# Patient Record
Sex: Male | Born: 1980 | Race: Black or African American | Hispanic: No | Marital: Married | Smoking: Former smoker
Health system: Southern US, Community
[De-identification: ages and names within clinical notes are randomized; demographics above are authoritative.]

## PROBLEM LIST (undated history)

## (undated) DIAGNOSIS — Z6834 Body mass index (BMI) 34.0-34.9, adult: Secondary | ICD-10-CM

## (undated) DIAGNOSIS — M545 Low back pain, unspecified: Secondary | ICD-10-CM

## (undated) DIAGNOSIS — I10 Essential (primary) hypertension: Secondary | ICD-10-CM

## (undated) DIAGNOSIS — I499 Cardiac arrhythmia, unspecified: Secondary | ICD-10-CM

## (undated) HISTORY — DX: Body mass index (BMI) 34.0-34.9, adult: Z68.34

## (undated) HISTORY — PX: NO PAST SURGERIES: SHX2092

## (undated) HISTORY — DX: Low back pain: M54.5

## (undated) HISTORY — DX: Cardiac arrhythmia, unspecified: I49.9

## (undated) HISTORY — DX: Low back pain, unspecified: M54.50

---

## 2001-04-28 ENCOUNTER — Emergency Department (HOSPITAL_COMMUNITY): Admission: EM | Admit: 2001-04-28 | Discharge: 2001-04-28 | Payer: Self-pay | Admitting: Emergency Medicine

## 2001-09-29 ENCOUNTER — Emergency Department (HOSPITAL_COMMUNITY): Admission: EM | Admit: 2001-09-29 | Discharge: 2001-09-29 | Payer: Self-pay | Admitting: Emergency Medicine

## 2001-12-29 ENCOUNTER — Emergency Department (HOSPITAL_COMMUNITY): Admission: EM | Admit: 2001-12-29 | Discharge: 2001-12-29 | Payer: Self-pay | Admitting: Emergency Medicine

## 2002-07-14 ENCOUNTER — Emergency Department (HOSPITAL_COMMUNITY): Admission: EM | Admit: 2002-07-14 | Discharge: 2002-07-14 | Payer: Self-pay | Admitting: Emergency Medicine

## 2002-12-19 ENCOUNTER — Emergency Department (HOSPITAL_COMMUNITY): Admission: EM | Admit: 2002-12-19 | Discharge: 2002-12-19 | Payer: Self-pay | Admitting: Emergency Medicine

## 2003-02-02 ENCOUNTER — Emergency Department (HOSPITAL_COMMUNITY): Admission: EM | Admit: 2003-02-02 | Discharge: 2003-02-02 | Payer: Self-pay | Admitting: Emergency Medicine

## 2003-08-18 ENCOUNTER — Emergency Department (HOSPITAL_COMMUNITY): Admission: EM | Admit: 2003-08-18 | Discharge: 2003-08-18 | Payer: Self-pay | Admitting: Emergency Medicine

## 2003-12-16 ENCOUNTER — Emergency Department (HOSPITAL_COMMUNITY): Admission: EM | Admit: 2003-12-16 | Discharge: 2003-12-16 | Payer: Self-pay | Admitting: Emergency Medicine

## 2004-03-12 ENCOUNTER — Emergency Department (HOSPITAL_COMMUNITY): Admission: EM | Admit: 2004-03-12 | Discharge: 2004-03-12 | Payer: Self-pay | Admitting: Emergency Medicine

## 2004-04-13 ENCOUNTER — Emergency Department (HOSPITAL_COMMUNITY): Admission: EM | Admit: 2004-04-13 | Discharge: 2004-04-13 | Payer: Self-pay | Admitting: Emergency Medicine

## 2004-05-08 ENCOUNTER — Emergency Department (HOSPITAL_COMMUNITY): Admission: EM | Admit: 2004-05-08 | Discharge: 2004-05-08 | Payer: Self-pay | Admitting: Emergency Medicine

## 2005-03-21 ENCOUNTER — Emergency Department (HOSPITAL_COMMUNITY): Admission: EM | Admit: 2005-03-21 | Discharge: 2005-03-21 | Payer: Self-pay | Admitting: Emergency Medicine

## 2005-11-30 ENCOUNTER — Inpatient Hospital Stay (HOSPITAL_COMMUNITY): Admission: EM | Admit: 2005-11-30 | Discharge: 2005-12-02 | Payer: Self-pay | Admitting: *Deleted

## 2005-11-30 ENCOUNTER — Ambulatory Visit: Payer: Self-pay | Admitting: Internal Medicine

## 2005-12-01 ENCOUNTER — Encounter: Payer: Self-pay | Admitting: Internal Medicine

## 2005-12-01 ENCOUNTER — Encounter: Payer: Self-pay | Admitting: Vascular Surgery

## 2005-12-09 ENCOUNTER — Ambulatory Visit: Payer: Self-pay | Admitting: *Deleted

## 2007-03-15 ENCOUNTER — Emergency Department (HOSPITAL_COMMUNITY): Admission: EM | Admit: 2007-03-15 | Discharge: 2007-03-15 | Payer: Self-pay | Admitting: Family Medicine

## 2007-08-15 ENCOUNTER — Emergency Department (HOSPITAL_COMMUNITY): Admission: EM | Admit: 2007-08-15 | Discharge: 2007-08-15 | Payer: Self-pay | Admitting: Family Medicine

## 2007-09-09 ENCOUNTER — Emergency Department (HOSPITAL_COMMUNITY): Admission: EM | Admit: 2007-09-09 | Discharge: 2007-09-09 | Payer: Self-pay | Admitting: Emergency Medicine

## 2007-09-20 ENCOUNTER — Emergency Department (HOSPITAL_COMMUNITY): Admission: EM | Admit: 2007-09-20 | Discharge: 2007-09-20 | Payer: Self-pay | Admitting: Emergency Medicine

## 2007-12-11 ENCOUNTER — Emergency Department (HOSPITAL_COMMUNITY): Admission: EM | Admit: 2007-12-11 | Discharge: 2007-12-11 | Payer: Self-pay | Admitting: Emergency Medicine

## 2008-07-10 ENCOUNTER — Emergency Department (HOSPITAL_COMMUNITY): Admission: EM | Admit: 2008-07-10 | Discharge: 2008-07-10 | Payer: Self-pay | Admitting: Family Medicine

## 2008-09-30 ENCOUNTER — Emergency Department (HOSPITAL_COMMUNITY): Admission: EM | Admit: 2008-09-30 | Discharge: 2008-09-30 | Payer: Self-pay | Admitting: Emergency Medicine

## 2008-12-05 ENCOUNTER — Emergency Department (HOSPITAL_COMMUNITY): Admission: EM | Admit: 2008-12-05 | Discharge: 2008-12-05 | Payer: Self-pay | Admitting: Emergency Medicine

## 2009-02-16 ENCOUNTER — Emergency Department (HOSPITAL_COMMUNITY): Admission: EM | Admit: 2009-02-16 | Discharge: 2009-02-16 | Payer: Self-pay | Admitting: Family Medicine

## 2010-03-03 ENCOUNTER — Emergency Department (HOSPITAL_BASED_OUTPATIENT_CLINIC_OR_DEPARTMENT_OTHER)
Admission: EM | Admit: 2010-03-03 | Discharge: 2010-03-03 | Disposition: A | Discharge status: Home / Self Care | Payer: No Typology Code available for payment source | Attending: Emergency Medicine | Admitting: Emergency Medicine

## 2010-03-03 ENCOUNTER — Emergency Department (INDEPENDENT_AMBULATORY_CARE_PROVIDER_SITE_OTHER): Payer: No Typology Code available for payment source

## 2010-03-03 ENCOUNTER — Emergency Department (HOSPITAL_BASED_OUTPATIENT_CLINIC_OR_DEPARTMENT_OTHER): Payer: No Typology Code available for payment source

## 2010-03-03 DIAGNOSIS — M545 Low back pain, unspecified: Secondary | ICD-10-CM | POA: Insufficient documentation

## 2010-03-03 DIAGNOSIS — M47817 Spondylosis without myelopathy or radiculopathy, lumbosacral region: Secondary | ICD-10-CM | POA: Insufficient documentation

## 2010-03-03 DIAGNOSIS — Y92410 Unspecified street and highway as the place of occurrence of the external cause: Secondary | ICD-10-CM | POA: Insufficient documentation

## 2010-03-03 DIAGNOSIS — I10 Essential (primary) hypertension: Secondary | ICD-10-CM | POA: Insufficient documentation

## 2010-03-03 DIAGNOSIS — R079 Chest pain, unspecified: Secondary | ICD-10-CM

## 2010-03-03 DIAGNOSIS — R071 Chest pain on breathing: Secondary | ICD-10-CM | POA: Insufficient documentation

## 2010-05-03 LAB — CBC
HCT: 44.1 % (ref 39.0–52.0)
Hemoglobin: 14.8 g/dL (ref 13.0–17.0)
MCHC: 33.6 g/dL (ref 30.0–36.0)
MCV: 87.9 fL (ref 78.0–100.0)
Platelets: 376 10*3/uL (ref 150–400)
RBC: 5.01 MIL/uL (ref 4.22–5.81)
RDW: 13.6 % (ref 11.5–15.5)
WBC: 9.3 10*3/uL (ref 4.0–10.5)

## 2010-05-03 LAB — COMPREHENSIVE METABOLIC PANEL
ALT: 26 U/L (ref 0–53)
AST: 23 U/L (ref 0–37)
Albumin: 4 g/dL (ref 3.5–5.2)
Alkaline Phosphatase: 73 U/L (ref 39–117)
BUN: 7 mg/dL (ref 6–23)
CO2: 24 mEq/L (ref 19–32)
Calcium: 8.7 mg/dL (ref 8.4–10.5)
Chloride: 110 mEq/L (ref 96–112)
Creatinine, Ser: 0.82 mg/dL (ref 0.4–1.5)
GFR calc Af Amer: >60 mL/min (ref >60)
GFR calc non Af Amer: >60 mL/min (ref >60)
Glucose, Bld: 111 mg/dL — ABNORMAL HIGH (ref 70–99)
Potassium: 4.2 mEq/L (ref 3.5–5.1)
Sodium: 140 mEq/L (ref 135–145)
Total Bilirubin: 0.9 mg/dL (ref 0.3–1.2)
Total Protein: 6.8 g/dL (ref 6.0–8.3)

## 2010-05-03 LAB — URINALYSIS, ROUTINE W REFLEX MICROSCOPIC
Bilirubin Urine: NEGATIVE
Glucose, UA: NEGATIVE mg/dL
Hgb urine dipstick: NEGATIVE
Ketones, ur: NEGATIVE mg/dL
Nitrite: NEGATIVE
Protein, ur: NEGATIVE mg/dL
Specific Gravity, Urine: 1.026 (ref 1.005–1.030)
Urobilinogen, UA: 0.2 mg/dL (ref 0.0–1.0)
pH: 5.5 (ref 5.0–8.0)

## 2010-05-03 LAB — DIFFERENTIAL
Basophils Absolute: 0 10*3/uL (ref 0.0–0.1)
Basophils Relative: 0 % (ref 0–1)
Eosinophils Absolute: 0.2 10*3/uL (ref 0.0–0.7)
Eosinophils Relative: 2 % (ref 0–5)
Lymphocytes Relative: 25 % (ref 12–46)
Lymphs Abs: 2.3 10*3/uL (ref 0.7–4.0)
Monocytes Absolute: 0.6 10*3/uL (ref 0.1–1.0)
Monocytes Relative: 6 % (ref 3–12)
Neutro Abs: 6.2 10*3/uL (ref 1.7–7.7)
Neutrophils Relative %: 66 % (ref 43–77)

## 2010-05-03 LAB — LIPASE, BLOOD: Lipase: 46 U/L (ref 11–59)

## 2010-06-14 NOTE — H&P (Signed)
Andres Ortiz, Andres Ortiz           ACCOUNT NO.:  [id number]   MEDICAL RECORD NO.:  [id number]          PATIENT TYPE:  INP   LOCATION:  2001                         FACILITY:  [facility]   PHYSICIAN:  Bevelyn Buckles. Bensimhon, MDDATE OF BIRTH:  1980/10/04   DATE OF ADMISSION:  11/30/2005  DATE OF DISCHARGE:                                HISTORY & PHYSICAL   He is new to [facility] cardiology.   REASON FOR ADMISSION:  Chest pain with ST elevation on EKG.   HISTORY OF PRESENT ILLNESS:  Andres Ortiz is 30 year old male with a history  of hypertension and obesity.  He denies any known cardiac history.  He has  never had a cardiac catheterization or stress test.  He tells me that for  the past 6 months, he has had intermittent chest discomfort.  This feels  pressure-like.  It is worse when he gets stressed out.  He says recently it  has been coming on more frequently.  This afternoon when he was at his  sister's house, he was working on a fence and noticed severe 10/10 central  chest pressure.  There are no associated symptoms.  He finally came to  emergency room for evaluation.  EKG shows sinus rhythm with a rate of 90.  There was 2-3 mm of ST elevation in V2, 1 mm in V1 with also 1 mm in I and  aVL.  In the ER, he was not having significant chest pain.  His first set of  cardiac markers were negative.   REVIEW OF SYSTEMS:  GENITOURINARY:  He denies any bright red blood per  rectum.  He has not had melena.  GASTROINTESTINAL:  His pain is worse when  sitting up and better when lying back.  He thinks it is probably related to  gas.  NEUROLOGIC:  He has not had any neurologic problems.  He denies any  claudication.  CARDIOVASCULAR:  No heart failure.  The remainder of his  systems is negative except for HPI and problem list.   PROBLEM LIST:  1. Hypertension.  2. Tobacco use, ongoing.   MEDICATIONS:  None.   ALLERGIES:  No known drug allergies.   SOCIAL HISTORY:  He lives in [place].  He  smokes about a pack of  cigarettes a day.  He drinks socially.  There is also a history of marijuana  use.  He denies any cocaine or other drugs.   FAMILY HISTORY:  His mother and father are alive and well in their 84s.  There is no family history of coronary artery disease.  He has one sister  who is alive and well.  He has four or five other sisters which he does not  know much about.   PHYSICAL EXAMINATION:  GENERAL:  He is lying in bed in no acute distress.  VITAL SIGNS:  Respirations unlabored, blood pressure is 145/93, pulse is 95  saturating 97% on room air.  HEENT:  Sclerae anicteric.  EOMI.  There is no xanthelasma.  Mucous  membranes are moist.  NECK:  Supple.  No JVD.  Carotids are 2+ bilaterally bruits.  There  is no  lymphadenopathy or thyromegaly.  CARDIAC:  Regular rate and rhythm.  No murmurs, rubs or gallops.  LUNGS:  Clear.  ABDOMEN:  Obese, nontender, nondistended, no hepatosplenomegaly, no bruits.  No masses.  Good bowel sounds.  EXTREMITIES:  Warm with no cyanosis,  clubbing or edema.  Femoral pulses are 2+ bilaterally without bruits.  DP  pulses are 2+.  There are no rashes or arthropathies.  NEUROLOGIC:  He is alert and oriented x3.  Cranial nerves 2-12 are intact.  He moves all four extremities without difficulty.   LABORATORY DATA AND X-RAY FINDINGS:  Point of care markers are negative with  a CK-MB of 2.1, troponin of less than 0.05.  Sodium 140, potassium 3.6,  chloride 111, BUN is 9, creatinine 1.1.  His hemoglobin is 15.3, white count  13.6, platelets 396.   EKG shows sinus rhythm at a rate of 90.  There is a nonspecific  interventricular conduction delay at 100 msec.  There is of 1 mm ST  elevation in V1, 3 mm in V2.  There is also a 1-2 mm in I and aVL.  Chest x-  ray is pending.   ASSESSMENT:  1. Stuttering chest pain with ST elevation on EKG with normal cardiac      markers.  2. Hypertension.  3. Tobacco use.  4. Obesity.   DISCUSSION/PLAN:   Given his young age and negative enzymes in the face of  significant chest pain, my suspicion for a ruptured plaque here is quite  low.  However, his EKG is quite concerning.  I have discussed the situation  with both him and the woman with him here this evening.  I have suggested  that we go to cardiac catheterization to clearly define his coronary  anatomy.  They have agreed to proceed with this route.  Also, a differential  would be focal myocarditis or coronary vasospasm.  We will assess these  further after his catheterization.  He also needs a smoking cessation  consult.      Bevelyn Buckles. Bensimhon, MD  Electronically Signed     DRB/MEDQ  D:  11/30/2005  T:  12/01/2005  Job:  [id number]

## 2010-06-14 NOTE — Discharge Summary (Signed)
Andres Ortiz, Andres Ortiz           ACCOUNT NO.:  [id number]   MEDICAL RECORD NO.:  [id number]          PATIENT TYPE:  INP   LOCATION:  2001                         FACILITY:  [facility]   PHYSICIAN:  Andres Ortiz, MDDATE OF BIRTH:  16-Aug-1980   DATE OF ADMISSION:  11/30/2005  DATE OF DISCHARGE:  12/02/2005                                 DISCHARGE SUMMARY   DATE OF BIRTH:  [birth year]   PRIMARY CARE PHYSICIAN:  None.   CARDIOLOGIST:  The patient is new to Dr. Arvilla Ortiz.   REASON FOR ADMISSION:  Chest pain with ST elevation on electrocardiogram.   DISCHARGE DIAGNOSES:  1. Noncardiac chest pain.  2. Normal coronary arteries by catheterization.  3. Normal left ventricular function.  4. Abnormal electrocardiogram.  5. Hypertension.  6. Glucose intolerance.  7. Tobacco abuse.  8. Right femoral artery pseudoaneurysm, status post pseudoaneurysm      compression.   PROCEDURES PERFORMED THIS ADMISSION:  Cardiac catheterization by Dr. Arvilla Ortiz, November 30, 2005, revealing normal coronary arteries, normal LV  function, no evidence of coronary vasospasm.   ADMISSION HISTORY:  Andres Ortiz is a very pleasant 30 year old male patient  with a history of hypertension and obesity, who presented to the emergency  room on the date of admission with complaints of chest pressure.  His EKG  shows 2-3 mm of ST elevation in V2, 1 mm in V1, and 1 mm of elevation and 1  in AVL.  His initial cardiac markers were negative.  It was felt that given  his young age and negative enzymes that the likelihood of ruptured plaque  was quite low.  However, with his abnormal EKG, it was felt that he should  go directly to the catheterization lab to define his coronary anatomy.   HOSPITAL COURSE:  The patient was taken directly to the catheterization lab  upon admission.  He underwent cardiac catheterization by Dr. Gala Ortiz.  As  noted above, he has normal coronary arteries.  His LV  function is normal.  His right femoral artery was closed with an Angio-Seal device.  Other  etiologies of his chest pain were evaluated.  An echocardiogram was done and  revealed an EF of 65%.  No left ventricular regional wall motion  abnormalities.  The LV wall thickness was mildly increased.  He underwent a  chest CT that revealed a normal thoracic aorta, no evidence of pulmonary  embolus, mediastinal tissue is unremarkable.  CT of the abdomen revealed  normal abdominal aorta.  There was a high density in the dependent  gallbladder lumen.  It was felt by the radiologist that this was probably  related to the vicarious excretion of contrast from the recent cardiac  catheterization. Layering sludge or stones could also have this appearance.  The patient was ready for discharge on December 01, 2005.  However, he  developed significant right groin pain.  An ultrasound showed a small  pseudoaneurysm of the common femoral artery.  He underwent pseudoaneurysm  compression on the groin December 02, 2005.  At the time of this dictation,  he is still pending relook  ultrasound to ensure that his pseudoaneurysm has  stayed closed.  As long as this ultrasound is negative, he is able to  ambulate without any changes in his groin site.  He will be discharged to  home later today.  Smoking cessation was initiated this admission.  He has  been placed on proton pump inhibitor therapy.  He will also be kept on  hypertensive medication with Toprol-XL 75 mg daily.  He has also been  prescribed some pain medication to help him with recovery from the  pseudoaneurysm compression.  He will follow up with the physician assistant  per Dr. Gala Ortiz in a couple weeks.   LABS AND X-RAY DATA:  Echocardiogram and CT of the chest and abdomen as  noted above.  White count 13,000, hemoglobin 14.8, hematocrit 43.3, platelet  count 373,000.  D. dimer less than 0.22.  Sodium 137, potassium 3.6,  chloride 106, CO2 25,  glucose 110, BUN 6, creatinine 0.9.  Total bilirubin  0.7, alkaline phosphatase 70, AST 23, ALT 46, total protein 6.2, albumin  3.7.  Hemoglobin A1c 6.1.  Cardiac markers negative x2.  Total cholesterol  204, triglycerides 170, HDL 33, LDL 136, TSH 1.111.  Chest x-ray on  admission revealed no acute abnormalities.   DISCHARGE MEDICATIONS:  1. Protonix 40 mg a day.  2. Percocet 5/325 mg 1 to 2 tablets every 4-6 hours p.r.n. pain.  3. Toprol-XL 50 mg 1-1/2 tablets daily.   DIET:  Low-fat, low-sodium.   The patient may return to work on December 09, 2005.   ACTIVITY:  He is to increase his activity slowly.  No driving for 3 days.  No heavy lifting or strenuous activity for 1 week.  He may shower.  He may  walk up steps.   WOUND CARE:  He should call our office for any bleeding, bruising, or fever.   FOLLOWUP:  Will be with the physician assistant for Dr. Gala Ortiz on  December 09, 2005, at 9:45 a.m.   He has been asked to establish with a primary care physician.      Andres Newcomer, PA-C      Andres Buckles. Bensimhon, MD  Electronically Signed    SW/MEDQ  D:  12/02/2005  T:  12/03/2005  Job:  [id number]

## 2010-06-14 NOTE — Assessment & Plan Note (Signed)
[facility]                              CARDIOLOGY OFFICE NOTE   Andres Ortiz, SURGEON                    MRN:          [id number]  DATE:12/09/2005                            DOB:          [birth year]    This is a 30 year old African American male patient who presented to the  hospital with 2 to 3 mm ST elevations and chest pain.  He ruled out for an  MI but because of his abnormal EKG, he underwent cardiac catheterization  which revealed normal coronary arteries and normal left ventricular  function.  The patient also developed a right femoral artery pseudoaneurysm  and underwent pseudoaneurysm compression.   Since he has been home, he says he still feels jittery in his heart despite  the medication.  His blood pressure is elevated today and he is still  limping a bit from his right groin pseudoaneurysm.  He has not had any  significant chest pain.   CURRENT MEDICATIONS:  1. Protonix 40 mg daily.  2. Toprol-XL 50 mg 1-1/2 daily.   PHYSICAL EXAMINATION:  GENERAL:  Pleasant, 30 year old African American male  patient in no acute distress.  VITAL SIGNS: Blood pressure 156/92, pulse 95, weight 231.  NECK:  Without JVD, hepatojugular reflux, bruits or thyroid enlargement.  LUNGS:  Clear anterior and posterolateral.  HEART:  Regular rate and rhythm at 90 beats per minute.  Normal S1 and S2.  No murmur, rub, bruit, thrill, or heave.  ABDOMEN:  Soft without organomegaly, masses, lesions, or abnormal  tenderness.  Right groin is ecchymotic.  There is a small knot. There is no  widened pulse or bruit.  He does have bruising down to his anterior thigh  and groin.  EXTREMITIES:  He has good distal pulses.   IMPRESSION:  1. Noncardiac chest pain with normal coronary arteries and left      ventricular function on cardiac catheterization.  2. Abnormal electrocardiogram.  3. Hypertension, uncontrolled.  4. Glucose intolerance.  5. Tobacco abuse,  quit.  6. Right femoral pseudoaneurysm, status post compression.   PLAN:  At this time I have increased his Toprol-XL to 100 mg daily.  I have  asked him to lose weight and avoid salt intake.  I have encouraged him to  obtain an primary medical doctor which  he says he is in the process of doing to follow up his blood pressure and  glucose.  He can follow up with [place] as needed.      Jacolyn Reedy, PA-C  Electronically Signed      Cecil Cranker, MD, [facility]  Electronically Signed   ML/MedQ  DD: 12/09/2005  DT: 12/09/2005  Job #: [id number]

## 2010-06-14 NOTE — Cardiovascular Report (Signed)
NAMEGERIK, COBERLY           ACCOUNT NO.:  [id number]   MEDICAL RECORD NO.:  [id number]          PATIENT TYPE:  INP   LOCATION:  1829                         FACILITY:  [facility]   PHYSICIAN:  Bevelyn Buckles. Bensimhon, MDDATE OF BIRTH:  11/08/1980   DATE OF PROCEDURE:  DATE OF DISCHARGE:                              CARDIAC CATHETERIZATION   PRIMARY CARE PHYSICIAN:  None.   PATIENT IDENTIFICATION:  Mr. Junker is a 30 year old male with a history of  hypertension and ongoing tobacco use.  He presented to the emergency room  tonight with a 8-month history of stuttering chest pain.  This afternoon he  was helping his sister put up a fence and had 10/10 substernal chest  pressure.  He came to the ER for evaluation.  In the ER, EKG showed ST  elevation and multiple leads.  First set of enzymes were normal.  Given his  history, he was brought emergently to the cardiac catheterization lab for  diagnostic angiography.   PROCEDURES PERFORMED:  1. Selective coronary angiography.  2. Left heart cath.  3. Left ventriculogram.  4. Angio-Seal closure device.   DESCRIPTION OF PROCEDURE:  The risks, benefits of the catheterization were  explained.  Consent was signed and placed in the chart.  The right coronary  was anesthetized with 1% local lidocaine.  A 6-French arterial sheath was  placed in right femoral artery using a modified Seldinger technique.  Standard catheters including JL-4, JR-4 and angled pigtail used procedure  all catheter exchanges made over wire.  There are no apparent complications.  Of note during the procedure the patient did have significant amount of the  chest pain with dye injection.   Central aortic pressure 140/97 with a mean of 118.  LV pressure 140/90 with  an EDP of 14.  There is no aortic stenosis.   Left main was long but angiographically normal.   LAD was a long vessel coursing to the apex.  It gave off a moderate size  proximal diagonal and small mid  diagonal.  The distal vessel was relatively  small but there is no angiographic coronary disease.   Left circumflex was made up of a moderate-to-large size ramus and a large  branching OM1.  It is angiographically normal.   Right coronary was a large dominant vessel. Gave off a small RV branch, a  small acute marginal.  A moderate-sized PDA, a small PL and a large PL.  It  is angiographically normal.   Left ventriculogram done the RAO position showed an EF of 60% with no  obvious wall motion abnormalities.  There is no mitral regurgitation.   ASSESSMENT:  1. Normal coronary arteries.  2. Normal LV.  3. No evidence of coronary vasospasm.  I gave the patient 100 mcg of      intracoronary nitroglycerin with no significant change in the caliber      of his vessels.   DISCUSSION/PLAN:  I am unclear of the etiology of his chest pain. Currently  appears noncardiac.  We will proceed with checking a 2-D echocardiogram and  a D-dimer.  If there is any other  abnormalities consider chest CT.  Hopefully he can be discharged home in the morning.      Bevelyn Buckles. Bensimhon, MD  Electronically Signed     DRB/MEDQ  D:  11/30/2005  T:  12/01/2005  Job:  [id number]

## 2010-10-25 LAB — COMPREHENSIVE METABOLIC PANEL
ALT: 36
BUN: 7
CO2: 26
CO2: 27
Calcium: 9.1
Chloride: 110
Chloride: 107
Creatinine, Ser: 0.89
Creatinine, Ser: 0.83
GFR calc non Af Amer: >60
GFR calc non Af Amer: >60
Glucose, Bld: 111 — ABNORMAL HIGH
Sodium: 142
Total Bilirubin: 0.7
Total Bilirubin: 0.7

## 2010-10-25 LAB — STOOL CULTURE

## 2010-10-25 LAB — CBC
HCT: 46
Hemoglobin: 14.9
MCHC: 33.7
MCV: 87.1
MCV: 86.5
Platelets: 386
RBC: 5.28
RBC: 5.1
WBC: 12.4 — ABNORMAL HIGH

## 2010-10-25 LAB — DIFFERENTIAL
Basophils Absolute: 0.1
Basophils Absolute: 0.1
Basophils Relative: 1
Eosinophils Absolute: 0.1
Lymphocytes Relative: 32
Lymphs Abs: 1.7
Neutro Abs: 7
Neutrophils Relative %: 56
Neutrophils Relative %: 64

## 2010-10-25 LAB — URINALYSIS, ROUTINE W REFLEX MICROSCOPIC
Bilirubin Urine: NEGATIVE
Glucose, UA: NEGATIVE
Hgb urine dipstick: NEGATIVE
Ketones, ur: NEGATIVE
Nitrite: NEGATIVE
Protein, ur: NEGATIVE
Specific Gravity, Urine: 1.027
Urobilinogen, UA: 1
pH: 6.5

## 2010-10-25 LAB — LIPASE, BLOOD
Lipase: 23
Lipase: 54

## 2010-10-25 LAB — RAPID URINE DRUG SCREEN, HOSP PERFORMED
Barbiturates: NOT DETECTED
Benzodiazepines: NOT DETECTED

## 2010-10-29 LAB — POCT CARDIAC MARKERS
CKMB, poc: 1.8
Myoglobin, poc: 47.3
Troponin i, poc: <0.05

## 2012-01-29 ENCOUNTER — Emergency Department (HOSPITAL_COMMUNITY): Payer: Managed Care, Other (non HMO)

## 2012-01-29 ENCOUNTER — Emergency Department (HOSPITAL_COMMUNITY)
Admission: EM | Admit: 2012-01-29 | Discharge: 2012-01-29 | Disposition: A | Discharge status: Home / Self Care | Payer: Managed Care, Other (non HMO) | Attending: Emergency Medicine | Admitting: Emergency Medicine

## 2012-01-29 ENCOUNTER — Encounter (HOSPITAL_COMMUNITY): Payer: Self-pay | Admitting: Emergency Medicine

## 2012-01-29 DIAGNOSIS — M5430 Sciatica, unspecified side: Secondary | ICD-10-CM | POA: Insufficient documentation

## 2012-01-29 DIAGNOSIS — R269 Unspecified abnormalities of gait and mobility: Secondary | ICD-10-CM | POA: Insufficient documentation

## 2012-01-29 DIAGNOSIS — R059 Cough, unspecified: Secondary | ICD-10-CM | POA: Insufficient documentation

## 2012-01-29 DIAGNOSIS — R6883 Chills (without fever): Secondary | ICD-10-CM | POA: Insufficient documentation

## 2012-01-29 DIAGNOSIS — I10 Essential (primary) hypertension: Secondary | ICD-10-CM | POA: Insufficient documentation

## 2012-01-29 DIAGNOSIS — J40 Bronchitis, not specified as acute or chronic: Secondary | ICD-10-CM

## 2012-01-29 DIAGNOSIS — J209 Acute bronchitis, unspecified: Secondary | ICD-10-CM | POA: Insufficient documentation

## 2012-01-29 DIAGNOSIS — R05 Cough: Secondary | ICD-10-CM | POA: Insufficient documentation

## 2012-01-29 DIAGNOSIS — R062 Wheezing: Secondary | ICD-10-CM | POA: Insufficient documentation

## 2012-01-29 DIAGNOSIS — R079 Chest pain, unspecified: Secondary | ICD-10-CM | POA: Insufficient documentation

## 2012-01-29 DIAGNOSIS — Z87891 Personal history of nicotine dependence: Secondary | ICD-10-CM | POA: Insufficient documentation

## 2012-01-29 HISTORY — DX: Essential (primary) hypertension: I10

## 2012-01-29 MED ORDER — NAPROXEN 500 MG PO TABS: 500.0000 mg | ORAL_TABLET | Freq: Two times a day (BID) | ORAL | Status: DC

## 2012-01-29 MED ORDER — METHYLPREDNISOLONE 4 MG PO KIT: PACK | ORAL | Status: DC

## 2012-01-29 MED ORDER — CYCLOBENZAPRINE HCL 10 MG PO TABS: 10.0000 mg | ORAL_TABLET | Freq: Two times a day (BID) | ORAL | Status: DC | PRN

## 2012-01-29 MED ORDER — OXYCODONE-ACETAMINOPHEN 5-325 MG PO TABS: 1.0000 | ORAL_TABLET | Freq: Four times a day (QID) | ORAL | Status: DC | PRN

## 2012-01-29 MED ORDER — OXYCODONE-ACETAMINOPHEN 5-325 MG PO TABS
2.0000 | ORAL_TABLET | Freq: Once | ORAL | Status: AC
  Administered 2012-01-29: 2 via ORAL
  Filled 2012-01-29: qty 2

## 2012-01-29 MED ORDER — ALBUTEROL SULFATE HFA 108 (90 BASE) MCG/ACT IN AERS: 1.0000 | INHALATION_SPRAY | Freq: Four times a day (QID) | RESPIRATORY_TRACT | Status: DC | PRN

## 2012-01-29 NOTE — ED Notes (Signed)
Palpated patients blood pressure and it was 160.
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
## 2012-01-29 NOTE — ED Notes (Signed)
Patient transported to X-ray 

## 2012-01-29 NOTE — ED Notes (Signed)
Pt reports pain to lower back for the last week. Denies injury. Pt reports pain radiating down right leg. Denies bowel/ bladder incontinence. Ambulatory. Also c/o cough for the last 2 weeks.

## 2012-01-30 NOTE — ED Provider Notes (Signed)
Medical screening examination/treatment/procedure(s) were performed by non-physician practitioner and as supervising physician I was immediately available for consultation/collaboration.   Jakyria Bleau, MD 01/30/12 0055 

## 2013-07-14 ENCOUNTER — Ambulatory Visit: Payer: Managed Care, Other (non HMO) | Admitting: Family Medicine

## 2013-10-13 ENCOUNTER — Ambulatory Visit (HOSPITAL_COMMUNITY)
Admission: RE | Admit: 2013-10-13 | Discharge: 2013-10-13 | Disposition: A | Discharge status: Home / Self Care | Payer: Managed Care, Other (non HMO) | Source: Ambulatory Visit | Attending: Family Medicine | Admitting: Family Medicine

## 2013-10-13 ENCOUNTER — Ambulatory Visit (INDEPENDENT_AMBULATORY_CARE_PROVIDER_SITE_OTHER): Payer: Managed Care, Other (non HMO) | Admitting: Family Medicine

## 2013-10-13 ENCOUNTER — Encounter: Payer: Self-pay | Admitting: Family Medicine

## 2013-10-13 ENCOUNTER — Other Ambulatory Visit: Payer: Self-pay | Admitting: Family Medicine

## 2013-10-13 VITALS — BP 212/124 | HR 106 | Temp 98.1°F | Ht 69.0 in | Wt 236.0 lb

## 2013-10-13 DIAGNOSIS — F1290 Cannabis use, unspecified, uncomplicated: Secondary | ICD-10-CM

## 2013-10-13 DIAGNOSIS — M545 Low back pain, unspecified: Secondary | ICD-10-CM

## 2013-10-13 DIAGNOSIS — I160 Hypertensive urgency: Secondary | ICD-10-CM

## 2013-10-13 DIAGNOSIS — I10 Essential (primary) hypertension: Secondary | ICD-10-CM | POA: Insufficient documentation

## 2013-10-13 DIAGNOSIS — G8929 Other chronic pain: Secondary | ICD-10-CM | POA: Insufficient documentation

## 2013-10-13 DIAGNOSIS — R9431 Abnormal electrocardiogram [ECG] [EKG]: Secondary | ICD-10-CM

## 2013-10-13 DIAGNOSIS — M546 Pain in thoracic spine: Secondary | ICD-10-CM

## 2013-10-13 DIAGNOSIS — Z6834 Body mass index (BMI) 34.0-34.9, adult: Secondary | ICD-10-CM

## 2013-10-13 DIAGNOSIS — F1210 Cannabis abuse, uncomplicated: Secondary | ICD-10-CM

## 2013-10-13 LAB — COMPREHENSIVE METABOLIC PANEL
ALT: 37 U/L (ref 0–53)
AST: 25 U/L (ref 0–37)
Albumin: 4.4 g/dL (ref 3.5–5.2)
Alkaline Phosphatase: 90 U/L (ref 39–117)
BILIRUBIN TOTAL: 0.5 mg/dL (ref 0.2–1.2)
BUN: 12 mg/dL (ref 6–23)
CO2: 27 meq/L (ref 19–32)
CREATININE: 1.02 mg/dL (ref 0.50–1.35)
Calcium: 10.3 mg/dL (ref 8.4–10.5)
Chloride: 105 mEq/L (ref 96–112)
GLUCOSE: 66 mg/dL — AB (ref 70–99)
Potassium: 3.9 mEq/L (ref 3.5–5.3)
Sodium: 140 mEq/L (ref 135–145)
Total Protein: 7 g/dL (ref 6.0–8.3)

## 2013-10-13 LAB — LIPID PANEL
CHOL/HDL RATIO: 5.4 ratio
CHOLESTEROL: 233 mg/dL — AB (ref 0–200)
HDL: 43 mg/dL (ref >39)
LDL Cholesterol: 137 mg/dL — ABNORMAL HIGH (ref 0–99)
TRIGLYCERIDES: 266 mg/dL — AB (ref <150)
VLDL: 53 mg/dL — AB (ref 0–40)

## 2013-10-13 MED ORDER — LISINOPRIL 20 MG PO TABS: 20.0000 mg | ORAL_TABLET | Freq: Every day | ORAL | Status: DC

## 2013-10-13 NOTE — Assessment & Plan Note (Signed)
EKG today with no abnormal rhythm but with nonspecific findings of Q-waves in III and lateral T-wave changes; also with evidence for LVH by voltage and with questionable isolated ST elevation in V2. Referred to cardiology; pt apparently saw Dr. Gala Romney at one point and self-reports a normal cath, but would appreciate cardiology assistance in case EKG findings represent an issue in need of specialist intervention.

## 2013-10-13 NOTE — Assessment & Plan Note (Signed)
Markedly elevated BP on exam, with EKG findings as above; see HTN and abnormal EKG problem list notes. No evidence on exam for end-organ damage. Restarting lisinopril. Checking CMP and lipid panel, today, and will f/u as appropriate.

## 2013-10-13 NOTE — Assessment & Plan Note (Signed)
BMI >34. Briefly discussed diet / exercise improvements as weight-loss strategies as well as adjunct to medications for management of blood pressure. Referred to [email or web address] May consider referral to Dr. Gerilyn Pilgrim in the future.

## 2013-10-13 NOTE — Patient Instructions (Signed)
Thank you for coming in, today! It was nice to meet you.  I want you to restart lisinopril 20 mg daily for your blood pressure. It was very high, over 200 / 100 (212 / 124 was my check).  I will check some blood work on you today. I will call you or send you a letter with the results.  Your EKG is not completely normal, but I don't think there is anything to be acutely worried about. I do want you to see the cardiology doctors again, and I'll put in a referral today.  Come back in about 1-2 weeks for a nurse visit for BP check. Schedule a lab appointment on that same day to recheck your kidney function after starting the lisinopril. After that, plan to see me back in about 1 month. If your labs are abnormal or if I want you to be seen before then, I'll let you know to make a closer appointment. If you have any chest pain or other new / unusual symptoms, call the clinic or go to the emergency room. If you call after hours, ask for the on-call resident for family medicine. Please feel free to call with any questions or concerns at any time, at [phone]. --Dr. Casper Harrison

## 2013-10-13 NOTE — Assessment & Plan Note (Signed)
Briefly discussed marijuana use; pt is also a former smoker (quit in 2006). Counseled very briefly on cessation, but priority of today's visit was hypertensive urgency, so will need to revisit drug use, in the future.

## 2013-10-13 NOTE — Progress Notes (Signed)
Subjective:    Patient ID: Andres Ortiz, male    DOB: 1980/04/26, 33 y.o.   MRN: [id number]  HPI: Pt presents to clinic for to establish care. His main concern is his blood pressure. - reports he has been seen at urgent care at various times and was told he had HTN - has been on lisinopril in the past, but not in at least 1-2 months or more - specifically denies chest pain, SOB, change in vision, palpitations, LE edema - states he has been seen by a cardiology in the past and has had a normal cath in 2007 - states he has had an "irregular heart beat" in the past but "doesn't know what it was"  Pt describes that he has chronic low back pain that flares occasionally. - has been using cyclobenzaprine intermittently for a few years - occasionally uses ibuprofen but not regularly  Pt is a former smoker (quit 2006). He does smoke marijuana daily.  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  Family History  Problem Relation Age of Onset  . Hypertension Mother   . Arthritis Mother   . Arthritis Maternal Uncle   . Muscular dystrophy Paternal Aunt     father's brother  . Arthritis Maternal Grandmother   . Diabetes Maternal Grandmother     Past Medical History  Diagnosis Date  . Hypertension   . Low back pain   . Irregular heart rate     "They told me to always mention it but I don't know what it was."    Past Surgical History  Procedure Laterality Date  . No past surgeries      History   Social History  . Marital Status: Married    Spouse Name: N/A    Number of Children: N/A  . Years of Education: N/A   Occupational History  . Not on file.   Social History Main Topics  . Smoking status: Former Games developer  . Smokeless tobacco: Not on file  . Alcohol Use: 3.0 oz/week    5 Shots of liquor per week  . Drug Use: Yes    Special: Marijuana     Comment: daily marijuana use as of 10/13/13  . Sexual Activity: Yes   Other Topics Concern  . Not  on file   Social History Narrative  . No narrative on file   In addition to the above documentation, pt's PMH, surgical history, FH, and SH all reviewed and updated where appropriate in the EMR. I have also reviewed and updated the pt's allergies and current medications as appropriate.  Review of Systems: As above. Otherwise, full 12-system ROS was reviewed and all negative.     Objective:   Physical Exam BP 212/124  Pulse 106  Temp(Src) 98.1 F (36.7 C) (Oral)  Ht  (1.753 m)  Wt 236 lb (107.049 kg)  BMI 34.84 kg/m2 Initial machine-check BP 206/140. Gen: well-appearing adult male in NAD HEENT: [place]/AT, sclerae/conjunctivae clear, no lid lag, EOMI, PERRLA   MMM, posterior oropharynx clear, no cervical lymphadenopathy  neck supple with full ROM, no masses appreciated; thyroid not enlarged  Cardio: RRR, no murmur appreciated; distal pulses intact/symmetric, no carotid bruits Pulm: CTAB, no wheezes, normal WOB  Abd: soft, nondistended, BS+, no HSM Ext: warm/well-perfused, no cyanosis/clubbing/edema MSK: strength 5/5 in all four extremities, no frank joint deformity/effusion  normal ROM to all four extremities  Diffuse lumbar tenderness without point tenderness or muscle spasm  Negative  sitting straight-leg lift test, bilaterally Neuro/Psych: alert/oriented, sensation grossly intact; normal gait/balance  mood euthymic with congruent affect  EKG: NSR, Q-waves in III, with evidence for LVH by voltage, and nonspecific lateral T-wave changes, also with questionable isolated ST elevation in V2     Assessment & Plan:  See problem list notes. Precepted with Dr. Mauricio Po. Declines flu shot.  MDM Justification for New Patient Level IV visit (11914):  Data Points: 3 (order medical test - EKG, independent review of tracing)  Risk: moderate (Rx drug management)  Bobbye Morton, MD PGY-3, [facility] 10/13/2013, 6:28 PM

## 2013-10-13 NOTE — Assessment & Plan Note (Addendum)
A: Previously diagnosed at urgent care / hospitalizations / ED visits but has never had a PCP. Has been on lisinopril in the past, but not for 1-2+ months. No symptoms to suggest end-organ damage, though EKG is abnormal with baseline not known (see separate problem list note) and BP is >200 / >120.  P: Will check CMP today for baseline kidney function and restart lisinopril 20 mg daily; if Cr is markedly elevated will stop and consider alternate agent(s) (?amlodipine or Coreg). Also referred to cardiology today, as pt has very likely LVH and other abnormalities on EKG, though expect HTN itself should be manageable in a primary care setting.

## 2013-10-13 NOTE — Assessment & Plan Note (Signed)
Chronic in nature with occasional flares, managed with occasional ibuprofen and Flexeril, not currently especially bothersome. No muscle spasm or sciatica-type symptoms on exam, today. F/u as needed.

## 2013-10-14 ENCOUNTER — Encounter: Payer: Self-pay | Admitting: Family Medicine

## 2013-10-21 ENCOUNTER — Ambulatory Visit (INDEPENDENT_AMBULATORY_CARE_PROVIDER_SITE_OTHER): Payer: Managed Care, Other (non HMO) | Admitting: Family Medicine

## 2013-10-21 ENCOUNTER — Ambulatory Visit
Admission: RE | Admit: 2013-10-21 | Discharge: 2013-10-21 | Disposition: A | Discharge status: Home / Self Care | Payer: Managed Care, Other (non HMO) | Source: Ambulatory Visit | Attending: Family Medicine | Admitting: Family Medicine

## 2013-10-21 ENCOUNTER — Encounter: Payer: Self-pay | Admitting: Family Medicine

## 2013-10-21 ENCOUNTER — Telehealth: Payer: Self-pay | Admitting: *Deleted

## 2013-10-21 VITALS — BP 206/127 | HR 98 | Temp 98.0°F | Ht 69.0 in | Wt 232.4 lb

## 2013-10-21 DIAGNOSIS — R059 Cough, unspecified: Secondary | ICD-10-CM

## 2013-10-21 DIAGNOSIS — R05 Cough: Secondary | ICD-10-CM

## 2013-10-21 DIAGNOSIS — I10 Essential (primary) hypertension: Secondary | ICD-10-CM

## 2013-10-21 MED ORDER — HYDROCHLOROTHIAZIDE 25 MG PO TABS: 25.0000 mg | ORAL_TABLET | Freq: Every day | ORAL | Status: DC

## 2013-10-21 MED ORDER — ALBUTEROL SULFATE HFA 108 (90 BASE) MCG/ACT IN AERS: 2.0000 | INHALATION_SPRAY | Freq: Four times a day (QID) | RESPIRATORY_TRACT | Status: DC | PRN

## 2013-10-21 NOTE — Telephone Encounter (Signed)
Pt informed. Blount, Dese[place] CMA 

## 2013-10-21 NOTE — Progress Notes (Signed)
Patient ID: Andres Ortiz, male   DOB: [birth year], 33 y.o.   MRN: [id number]  HPI:  Pt presents for a same day appointment to discuss cough.  Has had cough since Sunday (5 days). Fever got up to 101 at home. Has taken ibuprofen and "mucinex fast track". No nasal congestion. Cough is productive of phlegm but is mostly dry. No history of asthma or lung problems. Smokes marijuana daily. Some decreased appetite. No one else has been sick. No chest pain. Has throat pain from cough. Going to visit [place] for the weekend this weekend.  Recently restarted on lisinopril for HTN. The cough started after starting lisinopril.  ROS: See HPI  PMFSH: hx HTN, marijuana use  PHYSICAL EXAM: BP 206/127  Pulse 98  Temp(Src) 98 F (36.7 C) (Oral)  Ht  (1.753 m)  Wt 232 lb 6.4 oz (105.416 kg)  BMI 34.30 kg/m2 Gen: NAD. Frequent cough. HEENT: NCAT, TM's clear bilat, no oral exudates. No anterior cervical LAD.  Heart: RRR no murmurs Lungs: CTAB, NWOB. No crackles or wheezes heard Neuro: grossly nonfocal, speech normal Ext: No appreciable lower extremity edema bilaterally   ASSESSMENT/PLAN:  1. Cough: ddx includes ACE-inhibitor cough vs. Infectious etiology such as viral bronchitis or bacterial pneumonia. Given report of fever to 101, and the fact that pt is going out of town this weekend to [place], will conservatively obtain CXR to evaluate for PNA. If it is positive for PNA, will rx antibiotics. In the meantime, treat with albuterol in the event he has some bronchospasm/irritation. If cough persists into next week despite these measures would consider switching ACE to ARB and monitoring for resolution.  2. HTN: BP remains elevated today, although frequent cough is likely not helping. Will add HCTZ  daily to pt's regimen. He has appt in a few days to have electrolytes & creatinine rechecked.    FOLLOW UP: F/u as needed if symptoms worsen or do not improve.   [place] J. Pollie Meyer, MD [facility]

## 2013-10-21 NOTE — Patient Instructions (Signed)
It was nice to meet you today!  For cough: -this might be a side effect of lisinopril -checking chest xray. I will call you if it is abnormal and you need antibiotics -follow up with Dr. Casper Harrison in a few weeks -I'm prescribing albuterol which might help a little  For blood pressure: -adding HCTZ  daily -keep appointment to recheck bloodwork in a few days  Be well, Dr. Pollie Meyer

## 2013-10-21 NOTE — Telephone Encounter (Signed)
Message copied by Pamelia Hoit on Fri Oct 21, 2013  4:51 PM ------      Message from: Latrelle Dodrill      Created: Fri Oct 21, 2013  3:55 PM       Red team, please inform patient that xray is normal. If cough persists next week he should talk with Dr. Casper Harrison about switching medicine as it may be due to his lisinopril. Thanks! Latrelle Dodrill, MD ------

## 2013-10-23 NOTE — Assessment & Plan Note (Signed)
BP remains elevated, will add HCTZ  daily. Needs monitoring to ensure cough resolves, as it could be side effect of lisinopril. Pt has appt in a few days for lab draw to recheck electrolytes & creatinine.

## 2013-10-25 ENCOUNTER — Other Ambulatory Visit: Payer: Managed Care, Other (non HMO)

## 2013-10-25 DIAGNOSIS — I10 Essential (primary) hypertension: Secondary | ICD-10-CM

## 2013-10-25 LAB — BASIC METABOLIC PANEL
BUN: 8 mg/dL (ref 6–23)
CHLORIDE: 105 meq/L (ref 96–112)
CO2: 20 mEq/L (ref 19–32)
CREATININE: 0.96 mg/dL (ref 0.50–1.35)
Calcium: 9.4 mg/dL (ref 8.4–10.5)
Glucose, Bld: 116 mg/dL — ABNORMAL HIGH (ref 70–99)
Potassium: 3.6 mEq/L (ref 3.5–5.3)
Sodium: 139 mEq/L (ref 135–145)

## 2013-10-25 NOTE — Progress Notes (Signed)
BMP DONE TODAY Argel Pablo 

## 2013-11-08 ENCOUNTER — Encounter: Payer: Self-pay | Admitting: Cardiovascular Disease

## 2013-11-08 ENCOUNTER — Ambulatory Visit: Payer: Managed Care, Other (non HMO) | Admitting: Cardiovascular Disease

## 2013-11-08 ENCOUNTER — Ambulatory Visit (INDEPENDENT_AMBULATORY_CARE_PROVIDER_SITE_OTHER): Payer: Managed Care, Other (non HMO) | Admitting: Cardiovascular Disease

## 2013-11-08 VITALS — BP 198/148 | HR 88 | Ht 69.0 in | Wt 236.4 lb

## 2013-11-08 DIAGNOSIS — R9431 Abnormal electrocardiogram [ECG] [EKG]: Secondary | ICD-10-CM

## 2013-11-08 DIAGNOSIS — I10 Essential (primary) hypertension: Secondary | ICD-10-CM

## 2013-11-08 MED ORDER — HYDROCHLOROTHIAZIDE 25 MG PO TABS: 25.0000 mg | ORAL_TABLET | Freq: Every day | ORAL | Status: DC

## 2013-11-08 NOTE — Assessment & Plan Note (Signed)
He reports having a headache when he started taking hydrochlorothiazide which might be just be coincidental. His blood pressure is still significantly elevated. I asked him to resume hydrochlorothiazide. If he does not tolerate this medication, consider treatment with amlodipine. I had a prolonged discussion with him about the importance of decreasing alcohol intake, regular exercise, weight loss and low sodium diet.

## 2013-11-08 NOTE — Progress Notes (Signed)
Primary care physician: Dr. Maryjean [address]ah[address].  HPI  This is a pleasant 33 year old African American male who was referred for evaluation of an abnormal EKG. He has not seen a physician in many years and recently established care. He was noted to be severely hypertensive with a systolic blood pressure greater than 200. His EKG was noted to be significantly abnormal with LVH and significant repolarization abnormalities especially in the precordial leads. The patient reports having high blood pressure for many years which has been untreated. He was informed about an abnormal EKG in 2007 and was seen by Dr. Gala RomneyBensimhon at that time. He underwent cardiac catheterization which showed no significant coronary artery disease. He denies any chest discomfort or shortness of breath. He has no history of diabetes and denies tobacco use. He does use marijuana on a regular basis and drinks 2 pints of liquor every other day. He reports having a stressful life overall with work and having to take care of 4 kids.   No Known Allergies   Current Outpatient Prescriptions on File Prior to Visit  Medication Sig Dispense Refill  . albuterol (PROVENTIL HFA;VENTOLIN HFA) 108 (90 BASE) MCG/ACT inhaler Inhale 2 puffs into the lungs every 6 (six) hours as needed for wheezing or shortness of breath.  1 Inhaler  0  . ibuprofen (ADVIL,MOTRIN) 200 MG tablet Take 600 mg by mouth every 6 (six) hours as needed. For pain      . lisinopril (PRINIVIL,ZESTRIL) 20 MG tablet Take 1 tablet (20 mg total) by mouth daily.  90 tablet  3   No current facility-administered medications on file prior to visit.     Past Medical History  Diagnosis Date  . Hypertension   . Low back pain   . Irregular heart rate     "They told me to always mention it but I don't know what it was."  . BMI 34.0-34.9,adult      Past Surgical History  Procedure Laterality Date  . No past surgeries       Family History  Problem Relation Age of Onset  .  Hypertension Mother   . Arthritis Mother   . Arthritis Maternal Uncle   . Muscular dystrophy Paternal Aunt     father's brother  . Arthritis Maternal Grandmother   . Diabetes Maternal Grandmother      History   Social History  . Marital Status: Married    Spouse Name: N/A    Number of Children: N/A  . Years of Education: N/A   Occupational History  . Not on file.   Social History Main Topics  . Smoking status: Former Games developermoker  . Smokeless tobacco: Not on file  . Alcohol Use: 3.0 oz/week    5 Shots of liquor per week  . Drug Use: Yes    Special: Marijuana     Comment: daily marijuana use as of 10/13/13  . Sexual Activity: Yes   Other Topics Concern  . Not on file   Social History Narrative  . No narrative on file     ROS A 10 point review of system was performed. It is negative other than that mentioned in the history of present illness.   PHYSICAL EXAM   BP 198/148  Pulse 88  Ht 5\' 9"  (1.753 m)  Wt 236 lb 6.4 oz (107.23 kg)  BMI 34.89 kg/m2 Constitutional: He is oriented to person, place, and time. He appears well-developed and well-nourished. No distress.  HENT: No nasal discharge.  Head: Normocephalic  and atraumatic.  Eyes: Pupils are equal and round.  No discharge. Neck: Normal range of motion. Neck supple. No JVD present. No thyromegaly present.  Cardiovascular: Normal rate, regular rhythm, normal heart sounds. Exam reveals no gallop and no friction rub. No murmur heard.  Pulmonary/Chest: Effort normal and breath sounds normal. No stridor. No respiratory distress. He has no wheezes. He has no rales. He exhibits no tenderness.  Abdominal: Soft. Bowel sounds are normal. He exhibits no distension. There is no tenderness. There is no rebound and no guarding.  Musculoskeletal: Normal range of motion. He exhibits no edema and no tenderness.  Neurological: He is alert and oriented to person, place, and time. Coordination normal.  Skin: Skin is warm and dry. No  rash noted. He is not diaphoretic. No erythema. No pallor.  Psychiatric: He has a normal mood and affect. His behavior is normal. Judgment and thought content normal.       EKG: Recent EKG showed normal sinus rhythm with left atrial enlargement, left ventricular hypertrophy with significant repolarization abnormalities including precordial ST elevation   ASSESSMENT AND PLAN

## 2013-11-08 NOTE — Patient Instructions (Signed)
Your physician has requested that you have an echocardiogram. Echocardiography is a painless test that uses sound waves to create images of your heart. It provides your doctor with information about the size and shape of your heart and how well your heart's chambers and valves are working. This procedure takes approximately one hour. There are no restrictions for this procedure.  Your physician has recommended you make the following change in your medication:  RESTART HCTZ 25mg  take one by mouth daily  Your physician recommends that you schedule a follow-up appointment in: 3 MONTHS with Dr Kirke CorinArida

## 2013-11-08 NOTE — Assessment & Plan Note (Signed)
The patient has significantly abnormal EKG which I suspect is likely due to hypertensive heart disease. I requested an echocardiogram for evaluation. Interestingly, his abnormal EKG and 2007 prompted cardiac catheterization which showed no significant disease at that time. He might require further ischemic evaluation pending the results of echocardiogram. If his ejection fraction is reduced, he will need to be treated with a beta blocker.

## 2013-11-15 ENCOUNTER — Ambulatory Visit (HOSPITAL_COMMUNITY): Payer: Managed Care, Other (non HMO) | Attending: Cardiovascular Disease | Admitting: Radiology

## 2013-11-15 DIAGNOSIS — R9431 Abnormal electrocardiogram [ECG] [EKG]: Secondary | ICD-10-CM | POA: Insufficient documentation

## 2013-11-15 DIAGNOSIS — I10 Essential (primary) hypertension: Secondary | ICD-10-CM | POA: Diagnosis not present

## 2013-11-15 DIAGNOSIS — F1010 Alcohol abuse, uncomplicated: Secondary | ICD-10-CM | POA: Insufficient documentation

## 2013-11-15 DIAGNOSIS — F1210 Cannabis abuse, uncomplicated: Secondary | ICD-10-CM | POA: Insufficient documentation

## 2013-11-15 NOTE — Progress Notes (Signed)
Echocardiogram performed.  

## 2013-11-22 ENCOUNTER — Encounter: Payer: Self-pay | Admitting: Family Medicine

## 2013-11-22 ENCOUNTER — Ambulatory Visit (INDEPENDENT_AMBULATORY_CARE_PROVIDER_SITE_OTHER): Payer: Managed Care, Other (non HMO) | Admitting: Family Medicine

## 2013-11-22 VITALS — BP 140/90 | HR 103 | Temp 97.8°F | Resp 18 | Wt 231.0 lb

## 2013-11-22 DIAGNOSIS — J069 Acute upper respiratory infection, unspecified: Secondary | ICD-10-CM

## 2013-11-22 MED ORDER — FLUTICASONE PROPIONATE 50 MCG/ACT NA SUSP: 2.0000 | Freq: Every day | NASAL | Status: DC

## 2013-11-22 MED ORDER — CETIRIZINE HCL 10 MG PO TABS: 10.0000 mg | ORAL_TABLET | Freq: Every day | ORAL | Status: DC

## 2013-11-22 NOTE — Patient Instructions (Signed)
Thank you for coming in,   Take the flonase 2 puffs twice a day for two weeks. Then as needed. Make sure to point the device up and out in your nostril.   Take the zyrtec daily.   You may take Afrin but only for three days.   You can try gargling with salt water, using a Netti pot and swallowing a spoonful of honey to help your throat.   If your symptoms persist for 7-10 more days, then follow up and may need to try antibiotics at that point.    Please feel free to call with any questions or concerns at any time, at [phone]. --Dr. Jordan LikesSchmitz

## 2013-11-22 NOTE — Progress Notes (Signed)
    Subjective   Andres Ortiz is a 33 y.o. male that presents for a same day visit  1. URI symptoms: first started three weeks ago. Had a cough with mucous. The cough has improved. Sinus pressure and green/brown snot when blowing. Fevers and night sweats. Tried mucinex, theraflu, tylenol cold and sinus, and decongestant. Hasn't helped much. Has stayed the same thoughtout. Felt like his sinus got worse. Some pain with swallowing. No sick contacts.   History  Substance Use Topics  . Smoking status: Former Games developermoker  . Smokeless tobacco: Not on file  . Alcohol Use: 3.0 oz/week    5 Shots of liquor per week    ROS Per HPI  Objective   BP 140/90  Pulse 103  Temp(Src) 97.8 F (36.6 C) (Oral)  Resp 18  Wt 231 lb (104.781 kg)  SpO2 99%  General: NAD, alert HEENT: no LAD, [place]/AT, erythematous oropharynx, uvula midline, normal conjunctiva, TM's clear and intact b/l, swollen turbinates b/l,  Respiratory/Chest: CTAB Cardiovascular: tachycardic, regular rhythm   Extremities: moves all freely  Neuro: no gross deficits.   Assessment and Plan   Please refer to problem based charting of assessment and plan

## 2013-11-23 ENCOUNTER — Encounter: Payer: Self-pay | Admitting: Family Medicine

## 2013-11-23 DIAGNOSIS — J069 Acute upper respiratory infection, unspecified: Secondary | ICD-10-CM | POA: Insufficient documentation

## 2013-11-23 NOTE — Assessment & Plan Note (Signed)
Symptoms most suggestive of a URI. Uvula midline. No trismus. No tonsillar exudates.  - flonase  - zyrtec  - afrin for three days.  - supportive care gargle with salt water, etc.  - f/u in 7-10 days if not improved.

## 2014-01-06 ENCOUNTER — Other Ambulatory Visit: Payer: Self-pay | Admitting: Family Medicine

## 2014-06-29 ENCOUNTER — Other Ambulatory Visit: Payer: Self-pay | Admitting: Family Medicine

## 2014-06-29 ENCOUNTER — Ambulatory Visit (INDEPENDENT_AMBULATORY_CARE_PROVIDER_SITE_OTHER): Payer: Managed Care, Other (non HMO) | Admitting: Family Medicine

## 2014-06-29 ENCOUNTER — Telehealth: Payer: Self-pay | Admitting: Family Medicine

## 2014-06-29 ENCOUNTER — Encounter: Payer: Self-pay | Admitting: Family Medicine

## 2014-06-29 VITALS — BP 144/98 | HR 92 | Temp 98.2°F | Ht 68.5 in | Wt 231.0 lb

## 2014-06-29 DIAGNOSIS — M549 Dorsalgia, unspecified: Secondary | ICD-10-CM | POA: Insufficient documentation

## 2014-06-29 DIAGNOSIS — M546 Pain in thoracic spine: Secondary | ICD-10-CM | POA: Diagnosis not present

## 2014-06-29 DIAGNOSIS — R109 Unspecified abdominal pain: Secondary | ICD-10-CM

## 2014-06-29 LAB — POCT URINALYSIS DIPSTICK
BILIRUBIN UA: NEGATIVE
Blood, UA: NEGATIVE
Glucose, UA: NEGATIVE
KETONES UA: NEGATIVE
LEUKOCYTES UA: NEGATIVE
Nitrite, UA: NEGATIVE
PH UA: 7
PROTEIN UA: NEGATIVE
Spec Grav, UA: 1.02
Urobilinogen, UA: 0.2

## 2014-06-29 MED ORDER — CYCLOBENZAPRINE HCL 5 MG PO TABS: 5.0000 mg | ORAL_TABLET | Freq: Three times a day (TID) | ORAL | Status: DC | PRN

## 2014-06-29 NOTE — Assessment & Plan Note (Signed)
Mid to low back pain that is right-sided with no tenderness to palpation and some radiation into right lower quadrant with mild tenderness. No neurologic findings or red flag symptoms. Most likely muscle spasm, though some visceral components to pain with occasional sharp radiation from back into the lower abdomen. Unlikely to be nephrolithiasis or pyelonephritis with this clinical picture. No rash to suggest shingles. -Continue stretching, apply heat, Flexeril prescribed 3 times a day when necessary and warned about drowsiness -UA to evaluate for blood or protein -Follow-up 2-3 weeks if symptoms unimproved or immediately if new symptoms or worsening develop

## 2014-06-29 NOTE — Addendum Note (Signed)
Addended by: Simone CuriaHEKKEKANDAM, Peg Fifer T on: 06/29/2014 04:18 PM   Modules accepted: Orders

## 2014-06-29 NOTE — Patient Instructions (Signed)
This is most likely a muscle spasm , so continue stretching and apply heat to this area. I prescribed some Flexeril to take 3 times daily as needed. Be aware that this can make you drowsy so do not operate heavy machinery or drive after taking one. Follow-up in 2-3 weeks if symptoms have not improved. We are also getting a urinalysis to make sure there is no blood or protein in your urine.  Seek immediate care if you have fevers, chills, or any new symptoms.  Best,  Leona SingletonMaria T Elyjah Hazan, MD  Muscle Cramps and Spasms Muscle cramps and spasms are when muscles tighten by themselves. They usually get better within minutes. Muscle cramps are painful. They are usually stronger and last longer than muscle spasms. Muscle spasms may or may not be painful. They can last a few seconds or much longer. HOME CARE  Drink enough fluid to keep your pee (urine) clear or pale yellow.  Massage, stretch, and relax the muscle.  Use a warm towel, heating pad, or warm shower water on tight muscles.  Place ice on the muscle if it is tender or in pain.  Put ice in a plastic bag.  Place a towel between your skin and the bag.  Leave the ice on for 15-20 minutes, 03-04 times a day.  Only take medicine as told by your doctor. GET HELP RIGHT AWAY IF:  Your cramps or spasms get worse, happen more often, or do not get better with time. MAKE SURE YOU:  Understand these instructions.  Will watch your condition.  Will get help right away if you are not doing well or get worse. Document Released: 12/27/2007 Document Revised: 05/10/2012 Document Reviewed: 12/31/2011 [facility] Patient Information 2015 [employer] [place]. This information is not intended to replace advice given to you by your health care provider. Make sure you discuss any questions you have with your health care provider.

## 2014-06-29 NOTE — Progress Notes (Addendum)
Patient ID: Andres Ortiz, male   DOB: Dec 19, 1980, 34 y.o.   MRN: [id number]  Called patient to discuss negative UA but reconsidering workup given nature of pain, this could be renal calculus despite negative UA for blood. He is amenable to CT stone protocol. Ordered and notified Latoya, CMA to please schedule and call patient. Will try for appt today or next 1-2 days, but if patient has severe worsening of symptoms/hematuria prior to this, asked him to please go to ED. He voiced understanding. Also asked him to pick up filter and remain hydrated, catch any stone that he may pass and bring for analysis to clinic.  Leona SingletonMaria T Ludwika Rodd, MD

## 2014-06-29 NOTE — Progress Notes (Addendum)
Patient ID: Andres PrestoFrancisco M Mccaughan, male   DOB: [birth year], 34 y.o.   MRN: [id number] Subjective:   CC: back /flank pain  HPI:   Patient presents to same-day evaluation for 1.5-2 months of back pain, initially beginning in the middle of his back and radiating to the right low back and into his right flank. Pain came out of the blue with no trauma or increase in activity or lifting. He tried ibuprofen for the first 1-2 weeks with no improvement in pain and pain seems to worsen. There is a constant throbbing pain with occasional stabbing pain. This feels different from his prior low back pain. He denies chest pain, dyspnea, vomiting, diarrhea, fevers, chills, weakness, loss of bowel or bladder function, rash, or other concerns. He does have occasional pain radiating into his right rib and the right side of his lower abdomen that is mild. Nothing makes the pain better or worse. He took some of a friend's Percocet and his wife's muscle relaxer that helped some. He denies any change in his bowel movements, any hematochezia, penile discharge, testicle pain, or blood in his urine. He also does not have any pain in his legs.   Review of Systems - Per HPI.   PMH-  History of marijjuana use, low back pain, hypertension , obesity    Objective:  Physical Exam BP 144/98 mmHg  Pulse 92  Temp(Src) 98.2 F (36.8 C) (Oral)  Ht 5' 8.5" (1.74 m)  Wt 231 lb (104.781 kg)  BMI 34.61 kg/m2 GEN: NAD, seated in exam room EXTR: Back with no deformity, erythema, or swelling;  full range of motion but with mild pain with right lateral bending. No tenderness to palpation of spine or paraspinal muscles; no increased tone to any point in paraspuinal muscles on palpation  5 out of 5 hip flexion, abduction, and abduction bilaterally   Mild abdominal tenderness right lower quadrant and flank with no point tenderness/rib tenderness ABD: NABS, no organomegaly, mild RLQ/flank/CVA TTP but otherwise no tenderness; NABS, no  organomegaly SKIN: No rash or cyanosis     Assessment:     Andres Ortiz is a 34 y.o. male here for back pain.    Plan:     # See problem list and after visit summary for problem-specific plans.   # Health Maintenance: Not discussed  Follow-up: Follow up in 2-3 weeks when necessary if symptoms are not improving or immediately if fevers, chills, severe worsening of pain, or new symptoms.   Leona SingletonMaria T Mee Macdonnell, MD [facility]

## 2014-06-29 NOTE — Telephone Encounter (Signed)
[employer] insurance---not pre cert required for CT scan

## 2014-06-29 NOTE — Telephone Encounter (Signed)
LMOVM for patient to return call. Please advise patient that his CT scan has been scheduled for Tues July 04, 2014 at 1:30pm ([facility]15[facility]

## 2014-06-30 ENCOUNTER — Other Ambulatory Visit: Payer: Self-pay | Admitting: Family Medicine

## 2014-06-30 NOTE — Progress Notes (Signed)
Patient scheduled by tia for 07-04-14. Dedra Matsuo,CMA

## 2014-07-04 ENCOUNTER — Ambulatory Visit (HOSPITAL_COMMUNITY): Payer: Managed Care, Other (non HMO)

## 2014-07-05 NOTE — Telephone Encounter (Signed)
Following up on this - what is going on with the CT scan appt? Looks like appt 6/7 listed as "cancelled". Do we know if pt or radiology cancelled, and is there a reschedule plan?   Andres SingletonMaria T Nobuko Gsell, MD

## 2014-07-06 NOTE — Telephone Encounter (Signed)
OK thank you!  Shamera Yarberry T Kathy Wahid, MD  

## 2014-07-06 NOTE — Telephone Encounter (Signed)
Patient cancelled appointment, told radiology that he would call them back if he decided to reschedule.

## 2014-10-08 ENCOUNTER — Other Ambulatory Visit: Payer: Self-pay | Admitting: Family Medicine

## 2014-11-10 ENCOUNTER — Encounter: Payer: Self-pay | Admitting: *Deleted

## 2014-11-14 ENCOUNTER — Encounter: Payer: Self-pay | Admitting: Cardiovascular Disease

## 2014-11-14 ENCOUNTER — Ambulatory Visit (INDEPENDENT_AMBULATORY_CARE_PROVIDER_SITE_OTHER): Payer: Managed Care, Other (non HMO) | Admitting: Cardiovascular Disease

## 2014-11-14 VITALS — BP 150/90 | HR 88 | Ht 68.0 in | Wt 225.8 lb

## 2014-11-14 DIAGNOSIS — I10 Essential (primary) hypertension: Secondary | ICD-10-CM | POA: Diagnosis not present

## 2014-11-14 DIAGNOSIS — I119 Hypertensive heart disease without heart failure: Secondary | ICD-10-CM | POA: Diagnosis not present

## 2014-11-14 MED ORDER — LISINOPRIL 40 MG PO TABS: 40.0000 mg | ORAL_TABLET | Freq: Every day | ORAL | Status: DC

## 2014-11-14 MED ORDER — AMLODIPINE BESYLATE 5 MG PO TABS: 5.0000 mg | ORAL_TABLET | Freq: Every day | ORAL | Status: DC

## 2014-11-14 NOTE — Assessment & Plan Note (Signed)
Echocardiogram from last year showed moderate left ventricular hypertrophy. Previous cardiac catheterization showed no evidence of significant coronary artery disease. I had a prolonged discussion with him again about the importance of continued healthy lifestyle changes.

## 2014-11-14 NOTE — Progress Notes (Signed)
Primary care physician: Dr. Maryjean Kahristopher Street.  HPI  This is a pleasant 34 year old African American male who is here today for a follow-up visit regarding hypertensive heart disease and refractory hypertension.  The patient is known to have an abnormal EKG going back to 2007 when he was seen by Dr. Gala RomneyBensimhon. He underwent cardiac catheterization due to significant anterior ST elevation which showed no significant coronary artery disease. He has known history of excessive alcohol use but has been trying to improve his lifestyle overall. He had an echocardiogram done in October 2015 which showed normal LV systolic function with moderate left ventricular hypertrophy. The patient has improved his lifestyle significantly over the last year and has been paying more attention to his diet and being more active. His blood pressure improved but continues to be not optimally controlled. He denies any chest pain or shortness of breath. He has a strong family history of hypertension.  No Known Allergies   Current Outpatient Prescriptions on File Prior to Visit  Medication Sig Dispense Refill  . hydrochlorothiazide (HYDRODIURIL) 25 MG tablet Take 1 tablet (25 mg total) by mouth daily. 90 tablet 3  . cyclobenzaprine (FLEXERIL) 5 MG tablet Take 1 tablet (5 mg total) by mouth 3 (three) times daily as needed for muscle spasms. (Patient not taking: Reported on 11/14/2014) 20 tablet 0   No current facility-administered medications on file prior to visit.     Past Medical History  Diagnosis Date  . Hypertension   . Low back pain   . Irregular heart rate     "They told me to always mention it but I don't know what it was."  . BMI 34.0-34.9,adult      Past Surgical History  Procedure Laterality Date  . No past surgeries       Family History  Problem Relation Age of Onset  . Hypertension Mother   . Arthritis Mother   . Arthritis Maternal Uncle   . Muscular dystrophy Paternal Aunt   . Arthritis  Maternal Grandmother   . Diabetes Maternal Grandmother   . Muscular dystrophy Paternal Uncle      Social History   Social History  . Marital Status: Married    Spouse Name: N/A  . Number of Children: N/A  . Years of Education: N/A   Occupational History  . Not on file.   Social History Main Topics  . Smoking status: Former Games developermoker  . Smokeless tobacco: Not on file  . Alcohol Use: 3.0 oz/week    5 Shots of liquor per week  . Drug Use: Yes    Special: Marijuana     Comment: daily marijuana use as of 10/13/13  . Sexual Activity: Yes   Other Topics Concern  . Not on file   Social History Narrative     ROS A 10 point review of system was performed. It is negative other than that mentioned in the history of present illness.   PHYSICAL EXAM   BP 150/90 mmHg  Pulse 88  Ht 5\' 8"  (1.727 m)  Wt 225 lb 12.8 oz (102.422 kg)  BMI 34.34 kg/m2  SpO2 95% Constitutional: He is oriented to person, place, and time. He appears well-developed and well-nourished. No distress.  HENT: No nasal discharge.  Head: Normocephalic and atraumatic.  Eyes: Pupils are equal and round.  No discharge. Neck: Normal range of motion. Neck supple. No JVD present. No thyromegaly present.  Cardiovascular: Normal rate, regular rhythm, normal heart sounds. Exam reveals no gallop and  no friction rub. No murmur heard.  Pulmonary/Chest: Effort normal and breath sounds normal. No stridor. No respiratory distress. He has no wheezes. He has no rales. He exhibits no tenderness.  Abdominal: Soft. Bowel sounds are normal. He exhibits no distension. There is no tenderness. There is no rebound and no guarding.  Musculoskeletal: Normal range of motion. He exhibits no edema and no tenderness.  Neurological: He is alert and oriented to person, place, and time. Coordination normal.  Skin: Skin is warm and dry. No rash noted. He is not diaphoretic. No erythema. No pallor.  Psychiatric: He has a normal mood and affect.  His behavior is normal. Judgment and thought content normal.       EKG: Normal sinus rhythm with LVH and repolarization abnormalities.   ASSESSMENT AND PLAN

## 2014-11-14 NOTE — Patient Instructions (Signed)
Medication Instructions:  Your physician has recommended you make the following change in your medication:  1. INCREASE Lisinopril to 40mg  take one tablet by mouth daily 2. START Amlodipine 5mg  take one by mouth daily  Labwork: No new orders.   Testing/Procedures: No new orders.   Follow-Up: Your physician wants you to follow-up in: 6 MONTHS with Dr Kirke CorinArida.  You will receive a reminder letter in the mail two months in advance. If you don't receive a letter, please call our office to schedule the follow-up appointment.   Any Other Special Instructions Will Be Listed Below (If Applicable).

## 2014-11-14 NOTE — Assessment & Plan Note (Signed)
Blood pressure improved from before but still not optimally controlled. I increased the dose of lisinopril to 40 mg once daily and added amlodipine 5 mg once daily. I discussed with him the importance of low sodium diet.

## 2014-12-01 ENCOUNTER — Ambulatory Visit (INDEPENDENT_AMBULATORY_CARE_PROVIDER_SITE_OTHER): Payer: Managed Care, Other (non HMO) | Admitting: Student

## 2014-12-01 ENCOUNTER — Encounter: Payer: Self-pay | Admitting: Student

## 2014-12-01 VITALS — BP 139/82 | HR 96 | Temp 98.1°F | Ht 68.5 in | Wt 222.2 lb

## 2014-12-01 DIAGNOSIS — J069 Acute upper respiratory infection, unspecified: Secondary | ICD-10-CM | POA: Diagnosis not present

## 2014-12-01 NOTE — Patient Instructions (Signed)
Return in 2-3 weeks Call the office with questions or concerns Take hot Tea, tylenol for fever, throat lozenges for sore throat

## 2014-12-04 DIAGNOSIS — J069 Acute upper respiratory infection, unspecified: Secondary | ICD-10-CM | POA: Insufficient documentation

## 2014-12-04 NOTE — Progress Notes (Addendum)
   Subjective:    Patient ID: Andres Ortiz, male    DOB: [birth year], 34 y.o.   MRN: [id number]   CC: Cough x 1.5 weeks  HPI  34 y/o M presenting for dry cough for 1.5 weeks.  Cough - Started 1.5 weeks ago with mild cough and sore throat.  -His school age daughter also was sick with similar symptoms at the same time - He did have a fever 3 days ago on 11/1 to 100.8.  - He treated his fever with motrin - While he has not have more fever, he continues to have cough and today would like to make sure he " does not have pneumonia" and " can be cleared [place] go to [place]   Review of Systems   See HPI for ROS.   Past Medical History  Diagnosis Date  . Hypertension   . Low back pain   . Irregular heart rate     "They told me to always mention it but I don't know what it was."  . BMI 34.0-34.9,adult    Past Surgical History  Procedure Laterality Date  . No past surgeries      Social History   Social History  . Marital Status: Married    Spouse Name: N/A  . Number of Children: N/A  . Years of Education: N/A   Occupational History  . Not on file.   Social History Main Topics  . Smoking status: Former Games developermoker  . Smokeless tobacco: Not on file  . Alcohol Use: 3.0 oz/week    5 Shots of liquor per week  . Drug Use: Yes    Special: Marijuana     Comment: daily marijuana use as of 10/13/13  . Sexual Activity: Yes   Other Topics Concern  . Not on file   Social History Narrative    Objective:  BP 139/82 mmHg  Pulse 96  Temp(Src) 98.1 F (36.7 C) (Oral)  Ht 5' 8.5" (1.74 m)  Wt 222 lb 4 oz (100.812 kg)  BMI 33.30 kg/m2  SpO2 97% Vitals and nursing note reviewed  General: NAD HEENT: normal oropharynx, non erythematous, no exudates Cardiac: RRR, Respiratory: CTAB, normal effort Neuro: alert and oriented, no focal deficits   Assessment & Plan:    URI (upper respiratory infection) Normal Vital signs and benign exam make complicated respiratory tract  infection like pneumonia unlikely. Likely Viral UI - Continue supportive care - Pt counseled to perform activities as tolerated     Tawnia Schirm A. Kennon RoundsHaney MD, MS Family Medicine Resident PGY-1 Pager [phone]

## 2014-12-04 NOTE — Assessment & Plan Note (Signed)
Normal Vital signs and benign exam make complicated respiratory tract infection like pneumonia unlikely. Likely Viral UI - Continue supportive care - Pt counseled to perform activities as tolerated

## 2015-02-01 ENCOUNTER — Other Ambulatory Visit: Payer: Self-pay | Admitting: Family Medicine

## 2015-02-28 ENCOUNTER — Encounter: Payer: Self-pay | Admitting: Family Medicine

## 2015-02-28 ENCOUNTER — Ambulatory Visit (INDEPENDENT_AMBULATORY_CARE_PROVIDER_SITE_OTHER): Payer: Managed Care, Other (non HMO) | Admitting: Family Medicine

## 2015-02-28 ENCOUNTER — Ambulatory Visit (HOSPITAL_COMMUNITY)
Admission: RE | Admit: 2015-02-28 | Discharge: 2015-02-28 | Disposition: A | Discharge status: Home / Self Care | Payer: Managed Care, Other (non HMO) | Source: Ambulatory Visit | Attending: Family Medicine | Admitting: Family Medicine

## 2015-02-28 VITALS — BP 151/99 | HR 102 | Temp 98.2°F | Ht 68.5 in | Wt 227.0 lb

## 2015-02-28 DIAGNOSIS — R0602 Shortness of breath: Secondary | ICD-10-CM

## 2015-02-28 DIAGNOSIS — J029 Acute pharyngitis, unspecified: Secondary | ICD-10-CM | POA: Diagnosis not present

## 2015-02-28 DIAGNOSIS — R9431 Abnormal electrocardiogram [ECG] [EKG]: Secondary | ICD-10-CM | POA: Diagnosis not present

## 2015-02-28 LAB — POCT RAPID STREP A (OFFICE): Rapid Strep A Screen: NEGATIVE

## 2015-02-28 MED ORDER — ALBUTEROL SULFATE HFA 108 (90 BASE) MCG/ACT IN AERS: 2.0000 | INHALATION_SPRAY | Freq: Four times a day (QID) | RESPIRATORY_TRACT | Status: DC | PRN

## 2015-02-28 MED ORDER — BENZONATATE 100 MG PO CAPS: 200.0000 mg | ORAL_CAPSULE | Freq: Three times a day (TID) | ORAL | Status: DC | PRN

## 2015-02-28 NOTE — Patient Instructions (Signed)
Cough Treatment - you should: -  Take over-the-counter ibuprofen or Tylenol as directed on the bottles for fever, pain, and/or inflammation. -  Mucinex for any phlegm/mucus production. -  Cough drops for throat discomfort. -  I placed an order for [employer].  This should help with the cough. -   Over-the-counter nasal saline spray may also help with much of the nasal irritation/congestion you may have.  You should be better in: 5-7 days Call us if you have severe shortness of breath, high fever or are not better in 2 weeks

## 2015-02-28 NOTE — Assessment & Plan Note (Signed)
Complained of some chest pain with coughing. EKG obtained which was concerning for ST changes in leads V1 and V2. However, looking through previous EKGs it appears as though this is unchanged from previous.  - No further workup is warranted at this time.

## 2015-02-28 NOTE — Progress Notes (Signed)
COUGH Started over the weekend. Patient noted cough, body aches, fever as initial symptoms. Gradually developed a headache with some shortness of breath after coughing. Cough is been relatively dry however he will occasionally cough up some sputum.  Has been coughing for 4-5 days. Cough is: Dry and deep Sputum production: Minimal Medications tried: Tylenol and Mucinex Taking blood pressure medications: Yes  Symptoms Runny nose: No Mucous in back of throat: Yes Throat burning or reflux: No Wheezing or asthma: Yes Fever: Yes Chest Pain: Yes with coughing Shortness of breath: Yes with coughing Leg swelling: No Hemoptysis: No Weight loss: No  ROS see HPI Smoking Status noted  Objective: BP 151/99 mmHg  Pulse 102  Temp(Src) 98.2 F (36.8 C) (Oral)  Ht 5' 8.5" (1.74 m)  Wt 227 lb (102.967 kg)  BMI 34.01 kg/m2  SpO2 97% Gen: NAD, alert, cooperative, very uncomfortable appearing HEENT: NCAT, EOMI, PERRL, OP erythematous and swollen without exudate, TMs clear bilaterally, nasal mucosa normal, LAD noted bilaterally. CV: RRR, no murmur Resp: CTAB, occasional wheezes with expiration wheezes, non-labored Ext: No edema, warm   Assessment and plan:  Viral pharyngitis Patient is here with signs and symptoms consistent with viral pharyngitis. Strep test, rapid, was negative. Physical exam yielded significant erythema and swelling of his oropharynx. No evidence of exudate. Bilateral lymphadenopathy noted. Centor score of 3 yields a 28-35% risk of streptococcal pharyngitis. However with negative rapid strep tests this risk drops considerably. Patient may also have some associated reactive airway secondary to this infection noted on exam. - Tessalon Perles for cough relief - Over-the-counter Tylenol/ibuprofen for fevers and discomfort. Over-the-counter Mucinex for sputum production as needed. - Encouraged adequate hydration with water and Gatorade. - 1 albuterol inhaler with instructions  and directions to use 2 puffs every 30 minutes as needed for shortness of breath and wheezing. (Symptomatic relief only.)  Next: If patient's systemic symptoms improve and his cough lingers there may be enough history for strong consideration to change ACE-inhibitor to an ARB (2nd time presenting w/ complaints of persistent cough.   Nonspecific abnormal electrocardiogram (ECG) (EKG) Complained of some chest pain with coughing. EKG obtained which was concerning for ST changes in leads V1 and V2. However, looking through previous EKGs it appears as though this is unchanged from previous.  - No further workup is warranted at this time.    Orders Placed This Encounter  Procedures  . Rapid Strep A  . EKG 12-Lead  . EKG 12-Lead    Ordered by [facility]     Meds ordered this encounter  Medications  . benzonatate (TESSALON PERLES) 100 MG capsule    Sig: Take 2 capsules (200 mg total) by mouth 3 (three) times daily as needed for cough.    Dispense:  30 capsule    Refill:  0  . albuterol (PROVENTIL HFA;VENTOLIN HFA) 108 (90 Base) MCG/ACT inhaler    Sig: Inhale 2 puffs into the lungs every 6 (six) hours as needed for wheezing or shortness of breath.    Dispense:  1 Inhaler    Refill:  0     Kathee Delton, MD,MS,  PGY2 02/28/2015 6:03 PM

## 2015-02-28 NOTE — Assessment & Plan Note (Signed)
Patient is here with signs and symptoms consistent with viral pharyngitis. Strep test, rapid, was negative. Physical exam yielded significant erythema and swelling of his oropharynx. No evidence of exudate. Bilateral lymphadenopathy noted. Centor score of 3 yields a 28-35% risk of streptococcal pharyngitis. However with negative rapid strep tests this risk drops considerably. Patient may also have some associated reactive airway secondary to this infection noted on exam. - Tessalon Perles for cough relief - Over-the-counter Tylenol/ibuprofen for fevers and discomfort. Over-the-counter Mucinex for sputum production as needed. - Encouraged adequate hydration with water and Gatorade. - 1 albuterol inhaler with instructions and directions to use 2 puffs every 30 minutes as needed for shortness of breath and wheezing. (Symptomatic relief only.)  Next: If patient's systemic symptoms improve and his cough lingers there may be enough history for strong consideration to change ACE-inhibitor to an ARB (2nd time presenting w/ complaints of persistent cough.

## 2015-11-26 ENCOUNTER — Other Ambulatory Visit: Payer: Self-pay | Admitting: Cardiovascular Disease

## 2015-11-26 DIAGNOSIS — I10 Essential (primary) hypertension: Secondary | ICD-10-CM

## 2015-11-26 NOTE — Telephone Encounter (Signed)
Review for refill. 

## 2015-12-27 ENCOUNTER — Other Ambulatory Visit: Payer: Self-pay | Admitting: Cardiovascular Disease

## 2015-12-27 DIAGNOSIS — I10 Essential (primary) hypertension: Secondary | ICD-10-CM

## 2015-12-27 NOTE — Telephone Encounter (Signed)
Review for refill. 

## 2016-01-07 ENCOUNTER — Other Ambulatory Visit: Payer: Self-pay | Admitting: Cardiovascular Disease

## 2016-01-07 DIAGNOSIS — I10 Essential (primary) hypertension: Secondary | ICD-10-CM

## 2016-02-12 ENCOUNTER — Ambulatory Visit: Payer: Managed Care, Other (non HMO) | Admitting: Cardiovascular Disease

## 2016-02-19 ENCOUNTER — Encounter: Payer: Self-pay | Admitting: Cardiovascular Disease

## 2016-02-19 ENCOUNTER — Ambulatory Visit (INDEPENDENT_AMBULATORY_CARE_PROVIDER_SITE_OTHER): Payer: Managed Care, Other (non HMO) | Admitting: Cardiovascular Disease

## 2016-02-19 VITALS — BP 164/110 | HR 86 | Ht 69.0 in | Wt 240.0 lb

## 2016-02-19 DIAGNOSIS — E782 Mixed hyperlipidemia: Secondary | ICD-10-CM | POA: Diagnosis not present

## 2016-02-19 DIAGNOSIS — I10 Essential (primary) hypertension: Secondary | ICD-10-CM | POA: Diagnosis not present

## 2016-02-19 LAB — COMPLETE METABOLIC PANEL WITH GFR
ALBUMIN: 4.3 g/dL (ref 3.6–5.1)
ALT: 34 U/L (ref 9–46)
AST: 26 U/L (ref 10–40)
Alkaline Phosphatase: 72 U/L (ref 40–115)
BILIRUBIN TOTAL: 0.8 mg/dL (ref 0.2–1.2)
BUN: 12 mg/dL (ref 7–25)
CALCIUM: 9.9 mg/dL (ref 8.6–10.3)
CO2: 26 mmol/L (ref 20–31)
CREATININE: 1.13 mg/dL (ref 0.60–1.35)
Chloride: 104 mmol/L (ref 98–110)
GFR, Est African American: >89 mL/min (ref >=60)
GFR, Est Non African American: 83 mL/min (ref >=60)
Glucose, Bld: 98 mg/dL (ref 65–99)
Potassium: 4.6 mmol/L (ref 3.5–5.3)
SODIUM: 140 mmol/L (ref 135–146)
TOTAL PROTEIN: 7.2 g/dL (ref 6.1–8.1)

## 2016-02-19 LAB — LIPID PANEL
CHOLESTEROL: 248 mg/dL — AB (ref <200)
HDL: 40 mg/dL — ABNORMAL LOW (ref >40)
LDL Cholesterol: 146 mg/dL — ABNORMAL HIGH (ref <100)
Total CHOL/HDL Ratio: 6.2 Ratio — ABNORMAL HIGH (ref <5.0)
Triglycerides: 308 mg/dL — ABNORMAL HIGH (ref <150)
VLDL: 62 mg/dL — ABNORMAL HIGH (ref <30)

## 2016-02-19 MED ORDER — AMLODIPINE BESYLATE 10 MG PO TABS: 10.0000 mg | ORAL_TABLET | Freq: Every day | ORAL | 11 refills | Status: DC

## 2016-02-19 MED ORDER — LISINOPRIL 40 MG PO TABS: 40.0000 mg | ORAL_TABLET | Freq: Every day | ORAL | 11 refills | Status: DC

## 2016-02-19 NOTE — Patient Instructions (Signed)
Medication Instructions:  Your physician has recommended you make the following change in your medication:  1. INCREASE Amlodipine to 10mg  take one tablet by mouth daily  Labwork: Your physician recommends that you have lab work today: LIPID and CMP  Testing/Procedures: No new orders.   Follow-Up: Your physician wants you to follow-up in: 1 YEAR with Dr Kirke CorinArida.  You will receive a reminder letter in the mail two months in advance. If you don't receive a letter, please call our office to schedule the follow-up appointment.   Any Other Special Instructions Will Be Listed Below (If Applicable).     If you need a refill on your cardiac medications before your next appointment, please call your pharmacy.

## 2016-02-19 NOTE — Progress Notes (Signed)
Cardiology Office Note   Date:  02/19/2016   ID:  Andres Ortiz, Andres Ortiz, Andres Ortiz  PCP:  Rodrigo Ran, MD  Cardiologist:   Lorine Bears, MD   Chief Complaint  Patient presents with  . Follow-up      History of Present Illness: Andres Ortiz is a 36 y.o.  African-American male who presents for A follow-up visit regarding essential hypertension. The patient is known to have an abnormal EKG going back to 2007 when he was seen by Dr. Gala Romney. He underwent cardiac catheterization due to significant anterior ST elevation which showed no significant coronary artery disease. His EKG demonstrates significant early repolarization.  He has known history of excessive alcohol use  and obesity He had an echocardiogram done in October 2015 which showed normal LV systolic function with moderate left ventricular hypertrophy. He has a strong family history of hypertension.  is here for prescription refills. He has not been consistent in taking his antihypertensive medications. He did not take him over the weekend as he was on a trip. He resumed amlodipine and lisinopril on Monday and is noted to be hypertensive today. He denies any chest pain or shortness of breath. He has not been exercising as much as before and has not been able to lose any weight.     Past Medical History:  Diagnosis Date  . BMI 34.0-34.9,adult   . Hypertension   . Irregular heart rate    "They told me to always mention it but I don't know what it was."  . Low back pain     Past Surgical History:  Procedure Laterality Date  . NO PAST SURGERIES       Current Outpatient Prescriptions  Medication Sig Dispense Refill  . amLODipine (NORVASC) 5 MG tablet TAKE 1 TABLET EVERY DAY 15 tablet 0  . lisinopril (PRINIVIL,ZESTRIL) 40 MG tablet TAKE 1 TABLET EVERY DAY 15 tablet 0   No current facility-administered medications for this visit.     Allergies:   Patient has no known allergies.     Social History:  The patient  reports that he has quit smoking. He has never used smokeless tobacco. He reports that he drinks about 3.0 oz of alcohol per week . He reports that he uses drugs, including Marijuana.   Family History:  The patient's family history includes Arthritis in his maternal grandmother, maternal uncle, and mother; Diabetes in his maternal grandmother; Hypertension in his mother; Muscular dystrophy in his paternal aunt and paternal uncle.    ROS:  Please see the history of present illness.   Otherwise, review of systems are positive for none.   All other systems are reviewed and negative.    PHYSICAL EXAM: VS:  BP (!) 164/110   Pulse 86   Ht 5\' 9"  (1.753 m)   Wt 240 lb (108.9 kg)   BMI 35.44 kg/m  , BMI Body mass index is 35.44 kg/m. GEN: Well nourished, well developed, in no acute distress  HEENT: normal  Neck: no JVD, carotid bruits, or masses Cardiac: RRR; no murmurs, rubs, or gallops,no edema  Respiratory:  clear to auscultation bilaterally, normal work of breathing GI: soft, nontender, nondistended, + BS MS: no deformity or atrophy  Skin: warm and dry, no rash Neuro:  Strength and sensation are intact Psych: euthymic mood, full affect   EKG:  EKG is ordered today. The ekg ordered today demonstratesnormal sinus rhythm with diffuse ST segment elevation consistent with early repolarization  Recent Labs: No results found for requested labs within last 8760 hours.    Lipid Panel    Component Value Date/Time   CHOL 233 (H) 10/13/2013 1659   TRIG 266 (H) 10/13/2013 1659   HDL 43 10/13/2013 1659   CHOLHDL 5.4 10/13/2013 1659   VLDL 53 (H) 10/13/2013 1659   LDLCALC 137 (H) 10/13/2013 1659      Wt Readings from Last 3 Encounters:  02/19/16 240 lb (108.9 kg)  02/28/15 227 lb (103 kg)  12/01/14 222 lb 4 oz (100.8 kg)       No flowsheet data found.    ASSESSMENT AND PLAN:  1.  Essential hypertension: Blood pressure is not optimally  controlled. I increased the dose of amlodipine to 10 mg daily and refill lisinopril 40 mg once daily. I discussed with him the importance of compliance in taking medications regularly. I also talked with him about the importance of healthy lifestyle changes. I requested basic metabolic profile.   I strongly advised him to establish with a primary care physician.    2. Hyperlipidemia: His LDL in 2015 was 137 with a triglyceride of 266. I requested lipid and liver profile.  3. Obesity: We talked today about the importance of diet and exercise.    Disposition:   FU with me in 1 year  Signed,  Lorine BearsMuhammad Arida, MD  02/19/2016 10:20 AM    [facility]

## 2016-04-11 ENCOUNTER — Ambulatory Visit (INDEPENDENT_AMBULATORY_CARE_PROVIDER_SITE_OTHER): Payer: 59 | Admitting: Obstetrics and Gynecology

## 2016-04-11 ENCOUNTER — Encounter: Payer: Self-pay | Admitting: Obstetrics and Gynecology

## 2016-04-11 VITALS — BP 148/90 | HR 95 | Temp 98.4°F | Wt 240.0 lb

## 2016-04-11 DIAGNOSIS — J0100 Acute maxillary sinusitis, unspecified: Secondary | ICD-10-CM | POA: Diagnosis not present

## 2016-04-11 MED ORDER — AMOXICILLIN-POT CLAVULANATE 875-125 MG PO TABS: 1.0000 | ORAL_TABLET | Freq: Two times a day (BID) | ORAL | 0 refills | Status: DC

## 2016-04-11 NOTE — Patient Instructions (Signed)

## 2016-04-11 NOTE — Progress Notes (Signed)
   Subjective:   Patient ID: Andres Ortiz, male    DOB: 14-Jan-1981, 36 y.o.   MRN: [id number]  Patient presents for Same Day Appointment  Chief Complaint  Patient presents with  . Sinusitis  . Nasal Congestion    HPI: #Sinus infection Patient states that for the last 3 and a half weeks he's been having sinus issues. He is complaining of congestion and pressure due to sinuses. States that he had a fever of 101F. Has been taking Tylenol around-the-clock so has not had any recurrence of fevers. Denies any rhinorrhea but when he tries to blow his nose he is getting dark brown green mucus. He has tried taking over-the-counter Mucinex, Sudafed, and ibuprofen. Nothing has helped with the sinus congestion. Also endorsing associated throat pain, headache, and ear pressure. History of sinusitis yearly. No sick contacts. Localizes sinus pressure to bilateral maxillary region.  Sinusitis Risk Factors Fever: yes   Headache/face pain: yes   Flu Risk Factors Headache: yes  Muscle aches: no  Severe fatigue: no   Red Flags  Stiff neck: no  Dyspnea: no  Rash: no  Swallowing difficulty: no   Review of Systems   See HPI for ROS.   History  Smoking Status  . Former Smoker  Smokeless Tobacco  . Never Used    Past medical history, surgical, family, and social history reviewed and updated in the EMR as appropriate.  Pertinent Historical Findings include: HTN Objective:  BP (!) 148/90   Pulse 95   Temp 98.4 F (36.9 C)   Wt 240 lb (108.9 kg)   SpO2 96%   BMI 35.44 kg/m  Vitals and nursing note reviewed  Physical Exam  Constitutional: He is well-developed, well-nourished, and in no distress.  HENT:  Right Ear: Tympanic membrane, external ear and ear canal normal.  Left Ear: Tympanic membrane, external ear and ear canal normal.  Nose: Right sinus exhibits maxillary sinus tenderness. Left sinus exhibits maxillary sinus tenderness.  Mouth/Throat: Oropharynx is clear and moist  and mucous membranes are normal. No oropharyngeal exudate.  Eyes: Conjunctivae and EOM are normal. Pupils are equal, round, and reactive to light.  Neck: Normal range of motion. Neck supple. No thyromegaly present.  Cardiovascular: Normal rate and regular rhythm.   Pulmonary/Chest: Effort normal and breath sounds normal.  Lymphadenopathy:    He has no cervical adenopathy.    Assessment & Plan:  1. Acute maxillary sinusitis, recurrence not specified Symptoms and clinical presentation consistent with sinusitis. Well-appearing and vitals stable. No concerning red flags. Will treat patient with Augmentin for 10 day period. Continue conservative measures such as hydration, Tylenol or ibuprofen for fever and discomfort.  Diagnosis and plan along with any newly prescribed medication(s) were discussed in detail with this patient today. The patient verbalized understanding and agreed with the plan. Patient advised if symptoms worsen return to clinic or ER.   PATIENT EDUCATION PROVIDED: See AVS   Caryl AdaJazma Illana Nolting, DO 04/11/2016, 8:34 AM PGY-3, [facility]

## 2016-05-28 ENCOUNTER — Other Ambulatory Visit: Payer: Self-pay | Admitting: Cardiovascular Disease

## 2016-05-28 DIAGNOSIS — I10 Essential (primary) hypertension: Secondary | ICD-10-CM

## 2016-07-29 ENCOUNTER — Ambulatory Visit (INDEPENDENT_AMBULATORY_CARE_PROVIDER_SITE_OTHER): Payer: 59 | Admitting: Family Medicine

## 2016-07-29 ENCOUNTER — Encounter: Payer: Self-pay | Admitting: Family Medicine

## 2016-07-29 ENCOUNTER — Other Ambulatory Visit: Payer: Self-pay | Admitting: Family Medicine

## 2016-07-29 ENCOUNTER — Telehealth: Payer: Self-pay | Admitting: Family Medicine

## 2016-07-29 ENCOUNTER — Ambulatory Visit (HOSPITAL_COMMUNITY)
Admission: RE | Admit: 2016-07-29 | Discharge: 2016-07-29 | Disposition: A | Discharge status: Home / Self Care | Payer: 59 | Source: Ambulatory Visit | Attending: Family Medicine | Admitting: Family Medicine

## 2016-07-29 VITALS — BP 160/95 | HR 94 | Temp 98.7°F | Wt 228.0 lb

## 2016-07-29 DIAGNOSIS — I10 Essential (primary) hypertension: Secondary | ICD-10-CM | POA: Diagnosis not present

## 2016-07-29 DIAGNOSIS — M546 Pain in thoracic spine: Secondary | ICD-10-CM

## 2016-07-29 DIAGNOSIS — G8929 Other chronic pain: Secondary | ICD-10-CM

## 2016-07-29 DIAGNOSIS — M778 Other enthesopathies, not elsewhere classified: Secondary | ICD-10-CM | POA: Insufficient documentation

## 2016-07-29 DIAGNOSIS — M5134 Other intervertebral disc degeneration, thoracic region: Secondary | ICD-10-CM | POA: Insufficient documentation

## 2016-07-29 MED ORDER — CYCLOBENZAPRINE HCL 5 MG PO TABS: 5.0000 mg | ORAL_TABLET | Freq: Three times a day (TID) | ORAL | 0 refills | Status: DC | PRN

## 2016-07-29 MED ORDER — IBUPROFEN 400 MG PO TABS: 400.0000 mg | ORAL_TABLET | Freq: Four times a day (QID) | ORAL | 0 refills | Status: DC | PRN

## 2016-07-29 NOTE — Progress Notes (Signed)
Subjective:     Patient ID: Andres Ortiz, male   DOB: [birth year], 36 y.o.   MRN: [id number]  Back Pain  This is a chronic problem. The current episode started more than 1 year ago (Going on for 2 yrs). The problem occurs constantly (Intermittent but has been constant in the last few months). The problem has been waxing and waning since onset. The pain is present in the thoracic spine. The quality of the pain is described as stabbing. The pain does not radiate. The pain is at a severity of 3/10 (Now pain is about 3/10 but can get up to 10/10  in severity). The symptoms are aggravated by position, twisting, standing and lying down. Pertinent negatives include no abdominal pain, bowel incontinence, chest pain, fever, headaches, leg pain or tingling. Risk factors: No recent trauma to his back. he fell off the ladder 5 months ago and landed on his legs not on his back.  He has tried bed rest and NSAIDs for the symptoms. The treatment provided no relief.  Hypertension  This is a chronic problem. The problem is unchanged. Pertinent negatives include no chest pain or headaches. Past treatments include ACE inhibitors and calcium channel blockers (He has been off his medication for 1 week due to his back pain. Otherwise his BP is well controlled on his medications.). Compliance problems: He has been off meds for 1 week.    Current Outpatient Prescriptions on File Prior to Visit  Medication Sig Dispense Refill  . lisinopril (PRINIVIL,ZESTRIL) 40 MG tablet TAKE 1 TABLET EVERY DAY 30 tablet 3  . amLODipine (NORVASC) 10 MG tablet Take 1 tablet (10 mg total) by mouth daily. (Patient not taking: Reported on 07/29/2016) 30 tablet 11   No current facility-administered medications on file prior to visit.    Past Medical History:  Diagnosis Date  . BMI 34.0-34.9,adult   . Hypertension   . Irregular heart rate    "They told me to always mention it but I don't know what it was."  . Low back pain    Vitals:   07/29/16 1035  BP: (!) 160/95  Pulse: 94  Temp: 98.7 F (37.1 C)  TempSrc: Oral  SpO2: 98%  Weight: 228 lb (103.4 kg)     Review of Systems  Constitutional: Negative for fever.  Respiratory: Negative.   Cardiovascular: Negative for chest pain.  Gastrointestinal: Negative for abdominal pain and bowel incontinence.  Genitourinary: Negative.   Musculoskeletal: Positive for back pain.  Neurological: Negative.  Negative for tingling and headaches.  All other systems reviewed and are negative.      Objective:   Physical Exam  Constitutional: He is oriented to person, place, and time. He appears well-developed. No distress.  Cardiovascular: Normal rate, regular rhythm, normal heart sounds and intact distal pulses.   No murmur heard. Pulmonary/Chest: Effort normal and breath sounds normal. No respiratory distress. He has no wheezes.  Abdominal: Soft. Bowel sounds are normal. He exhibits no distension and no mass. There is no tenderness. There is no guarding.  Musculoskeletal: He exhibits no edema.       Thoracic back: He exhibits tenderness. He exhibits normal range of motion, no bony tenderness and no deformity.       Lumbar back: Normal.       Back:  Neurological: He is alert and oriented to person, place, and time. He has normal reflexes. No cranial nerve deficit.  Nursing note and vitals reviewed.      Assessment:  Chronic thoracic back pain Essential HTN    Plan:     Check problem list.

## 2016-07-29 NOTE — Assessment & Plan Note (Signed)
Chronic ongoing for more than 2 yrs. No neurologic deficit. ??  Muscle spasm but unlikely given it's duration and no recent trauma to his back. Xray obtained to further evaluate his pain. NSAID prn pain and Flexeril prn prescribed. Red flag signs and indication for ED visit discussed. He agreed with plan and verbalized understanding.

## 2016-07-29 NOTE — Assessment & Plan Note (Signed)
BP elevated today due to medication non-adherence. He is advised to restart his BP meds right away. F/U with PCP for further management.

## 2016-07-29 NOTE — Telephone Encounter (Signed)
Xray report discussed with patient. Continue NSAID and Flexeril as discussed. I scheduled follow-up with his PCP for him. If pain persists or worsens despite conservative measures he will need an MRI/CT scan of his spine.  As discussed with him, he does have mild bone spurring with degeneration; this could be contributing to his symptoms as well. Return precaution discussed. He agreed with plan and read back my recommendation to me.

## 2016-07-29 NOTE — Patient Instructions (Signed)
It was nice seeing you. I am sorry about your back pain. Let us get an xray to determine the cause. If xray is normal and pain persist we will need a CT scan. Please use pain medication and muscle relaxant as prescribed. Go to the ED if symptoms persist or worsens.

## 2016-08-07 NOTE — Progress Notes (Signed)
   Subjective:   Patient ID: Andres Ortiz    DOB: December 09, 1980, 36 y.o. male   MRN: [id number]  CC: "Back pain follow-up"  HPI: Andres Ortiz is a 36 y.o. male who presents to clinic today back pain. Problems discussed today are as follows:  Back pain: Onset 1.5 years ago without history of trauma with progressive onset located on thoracic spine favoring right side. Patient states he was seen 7/3 with an 8/10 severity pain scale which has now improved to 3/10 in severity. He has been taking ibuprofen occasionally and 5 mg tablets of Flexeril twice daily when symptoms are worse.  ROS: Denies loss of motor function, decreased sensation, neck pain, dyspnea, chest pain, headache.  Hypertension: Patient has not been taking blood pressure medications for approximately 2-3 weeks. He has however taken his medications this morning. He states he stopped the medication to see if it was related to his kidneys.  Complete ROS performed, see HPI for pertinent.  PMFSH: HTN, marijuana use, obesity. No surgical hx. Smoking status reviewed. Medications reviewed.  Objective:   BP (!) 160/108   Pulse 86   Temp 98.3 F (36.8 C) (Oral)   Ht 5\' 9"  (1.753 m)   Wt 229 lb (103.9 kg)   SpO2 98%   BMI 33.82 kg/m  Vitals and nursing note reviewed.  General: well nourished, well developed, in no acute distress with non-toxic appearance HEENT: normocephalic, atraumatic, moist mucous membranes Neck: supple, non-tender without lymphadenopathy CV: regular rate and rhythm without murmurs, rubs, or gallops, no lower extremity edema Lungs: clear to auscultation bilaterally with normal work of breathing Skin: warm, dry, no rashes or lesions, cap refill < 2 seconds Extremities: warm and well perfused, normal tone MSK: Shoulders symmetrical without signs of scoliosis on exam, right rhomboids tense on palpation and mildly tender, full passive range of motion with spinal flexion and normal gait  Assessment &  Plan:   Primary hypertension Chronic. Uncontrolled. Patient not taking medication for 2-3 weeks with a short unexplained elevated blood pressure office. BP 142/96. On amlodipine and lisinopril. Nonsmoker. --Patient instructed to restart and continue amlodipine 10 mg daily and lisinopril 40 mg daily  Chronic bilateral thoracic back pain Chronic. Worse on right at approximately T5-6. Improved with use of NSAID and Flexeril. No neurological symptoms or findings. Thoracic x-ray unremarkable except for minimal spurring. Patient has gym membership and making efforts to walk and jog. --Instructed patient to utilize gym membership to help lose weight and improved back pain --Will hold on refilling Flexeril 5 mg tablets given improved symptoms, patient in agreement, can take Tylenol as needed for pain and ibuprofen when symptoms are worse  No orders of the defined types were placed in this encounter.  No orders of the defined types were placed in this encounter.   Durward Parcelavid McMullen, DO [facility], PGY-2 08/08/2016 10:52 AM

## 2016-08-08 ENCOUNTER — Ambulatory Visit (INDEPENDENT_AMBULATORY_CARE_PROVIDER_SITE_OTHER): Payer: 59 | Admitting: Family Medicine

## 2016-08-08 ENCOUNTER — Encounter: Payer: Self-pay | Admitting: Family Medicine

## 2016-08-08 DIAGNOSIS — I10 Essential (primary) hypertension: Secondary | ICD-10-CM | POA: Diagnosis not present

## 2016-08-08 DIAGNOSIS — G8929 Other chronic pain: Secondary | ICD-10-CM

## 2016-08-08 DIAGNOSIS — M546 Pain in thoracic spine: Secondary | ICD-10-CM | POA: Diagnosis not present

## 2016-08-08 NOTE — Assessment & Plan Note (Signed)
Chronic. Worse on right at approximately T5-6. Improved with use of NSAID and Flexeril. No neurological symptoms or findings. Thoracic x-ray unremarkable except for minimal spurring. Patient has gym membership and making efforts to walk and jog. --Instructed patient to utilize gym membership to help lose weight and improved back pain --Will hold on refilling Flexeril 5 mg tablets given improved symptoms, patient in agreement, can take Tylenol as needed for pain and ibuprofen when symptoms are worse

## 2016-08-08 NOTE — Patient Instructions (Signed)
Thank you for coming in to see us today. Please see below to review our plan for today's visit.  1. Your blood pressure is elevated. Please take her blood pressure medications every day do not miss doses. This can have damaging affects your health long term if you do not take these medications as prescribed. 2. I am pleased to hear your back pain is improved. Continue using conservative therapy including heat to area and Tylenol as needed. You can supplement with ibuprofen and Tylenol when symptoms are worse.  Return to clinic in 6 months or sooner if needed.  Please call the clinic at [phone] if your symptoms worsen or you have any concerns. It was my pleasure to see you. -- Durward Parcelavid McMullen, DO [facility], PGY-2

## 2016-08-08 NOTE — Assessment & Plan Note (Addendum)
Chronic. Uncontrolled. Patient not taking medication for 2-3 weeks with a short unexplained elevated blood pressure office. BP 142/96. On amlodipine and lisinopril. Nonsmoker. --Patient instructed to restart and continue amlodipine 10 mg daily and lisinopril 40 mg daily

## 2016-08-28 ENCOUNTER — Other Ambulatory Visit: Payer: Self-pay | Admitting: Cardiovascular Disease

## 2016-08-28 DIAGNOSIS — I10 Essential (primary) hypertension: Secondary | ICD-10-CM

## 2016-10-07 ENCOUNTER — Other Ambulatory Visit: Payer: Self-pay | Admitting: Cardiovascular Disease

## 2016-10-07 DIAGNOSIS — I10 Essential (primary) hypertension: Secondary | ICD-10-CM

## 2016-10-31 ENCOUNTER — Other Ambulatory Visit: Payer: Self-pay | Admitting: Cardiovascular Disease

## 2016-10-31 DIAGNOSIS — I10 Essential (primary) hypertension: Secondary | ICD-10-CM

## 2016-10-31 MED ORDER — LISINOPRIL 40 MG PO TABS: 40.0000 mg | ORAL_TABLET | Freq: Every day | ORAL | 0 refills | Status: DC

## 2017-01-26 ENCOUNTER — Other Ambulatory Visit: Payer: Self-pay | Admitting: Cardiovascular Disease

## 2017-01-26 DIAGNOSIS — I10 Essential (primary) hypertension: Secondary | ICD-10-CM

## 2017-01-26 MED ORDER — AMLODIPINE BESYLATE 10 MG PO TABS: 10.0000 mg | ORAL_TABLET | Freq: Every day | ORAL | 0 refills | Status: DC

## 2017-02-17 ENCOUNTER — Telehealth: Payer: Self-pay | Admitting: *Deleted

## 2017-02-17 NOTE — Telephone Encounter (Addendum)
Message left for the patient to call back to schedule his one year follow up with Dr. Kirke CorinArida

## 2017-03-31 ENCOUNTER — Ambulatory Visit: Payer: 59 | Admitting: Cardiovascular Disease

## 2017-03-31 VITALS — BP 150/90 | HR 85 | Ht 69.0 in | Wt 230.0 lb

## 2017-03-31 DIAGNOSIS — E782 Mixed hyperlipidemia: Secondary | ICD-10-CM

## 2017-03-31 DIAGNOSIS — I10 Essential (primary) hypertension: Secondary | ICD-10-CM

## 2017-03-31 DIAGNOSIS — Z6834 Body mass index (BMI) 34.0-34.9, adult: Secondary | ICD-10-CM | POA: Diagnosis not present

## 2017-03-31 MED ORDER — AMLODIPINE BESYLATE 10 MG PO TABS: 10.0000 mg | ORAL_TABLET | Freq: Every day | ORAL | 3 refills | Status: DC

## 2017-03-31 MED ORDER — LISINOPRIL 40 MG PO TABS: 40.0000 mg | ORAL_TABLET | Freq: Every day | ORAL | 3 refills | Status: DC

## 2017-03-31 NOTE — Patient Instructions (Signed)

## 2017-03-31 NOTE — Progress Notes (Signed)
Cardiology Office Note   Date:  03/31/2017   ID:  Andres Ortiz, DOB 06-19-1980, MRN [id number]  PCP:  Wendee BeaversMcMullen, David J, DO  Cardiologist:   Lorine BearsMuhammad Kateryn Marasigan, MD   Chief Complaint  Patient presents with  . Follow-up    1 year.      History of Present Illness: Andres Ortiz is a 37 y.o.  African-American male who presents for a follow-up visit regarding essential hypertension. The patient is known to have an abnormal EKG going back to 2007 when he was seen by Dr. Gala RomneyBensimhon. He underwent cardiac catheterization due to significant anterior ST elevation which showed no significant coronary artery disease. His EKG demonstrates significant early repolarization.  He has known history of excessive alcohol use  and obesity He had an echocardiogram done in October 2015 which showed normal LV systolic function with moderate left ventricular hypertrophy. He has a strong family history of hypertension.  He frequently stops his blood pressure medications.  He recently stopped for 3 weeks and resumed 2 days ago.  He reported back discomfort and he thought the medications were causing that.  However, he has known history of chronic lower back pain. No chest pain or shortness of breath.  He exercises almost daily now and has been doing so for the last 6 months.  He jogs about 2 miles in the morning and 2 miles in the evening.  He drinks 1 pint of liquor on the weekend and uses marijuana.  He denies tobacco use.  Past Medical History:  Diagnosis Date  . BMI 34.0-34.9,adult   . Hypertension   . Irregular heart rate    "They told me to always mention it but I don't know what it was."  . Low back pain     Past Surgical History:  Procedure Laterality Date  . NO PAST SURGERIES       Current Outpatient Medications  Medication Sig Dispense Refill  . amLODipine (NORVASC) 10 MG tablet Take 1 tablet (10 mg total) by mouth daily. Please make appt for January with Dr. Kirke CorinArida before  anymore refills. 1st attempt 30 tablet 0  . cyclobenzaprine (FLEXERIL) 5 MG tablet Take 1 tablet (5 mg total) by mouth 3 (three) times daily as needed for muscle spasms. 15 tablet 0  . ibuprofen (ADVIL,MOTRIN) 400 MG tablet Take 1 tablet (400 mg total) by mouth every 6 (six) hours as needed. 30 tablet 0  . lisinopril (PRINIVIL,ZESTRIL) 40 MG tablet Take 1 tablet (40 mg total) by mouth daily. 90 tablet 0   No current facility-administered medications for this visit.     Allergies:   Patient has no known allergies.    Social History:  The patient  reports that he quit smoking about 11 years ago. he has never used smokeless tobacco. He reports that he drinks about 3.0 oz of alcohol per week. He reports that he uses drugs. Drug: Marijuana.   Family History:  The patient's family history includes Arthritis in his maternal grandmother, maternal uncle, and mother; Diabetes in his maternal grandmother; Hypertension in his mother; Muscular dystrophy in his paternal aunt and paternal uncle.    ROS:  Please see the history of present illness.   Otherwise, review of systems are positive for none.   All other systems are reviewed and negative.    PHYSICAL EXAM: VS:  BP (!) 150/90   Pulse 85   Ht 5\' 9"  (1.753 m)   Wt 230 lb (104.3 kg)  BMI 33.97 kg/m  , BMI Body mass index is 33.97 kg/m. GEN: Well nourished, well developed, in no acute distress  HEENT: normal  Neck: no JVD, carotid bruits, or masses Cardiac: RRR; no murmurs, rubs, or gallops,no edema  Respiratory:  clear to auscultation bilaterally, normal work of breathing GI: soft, nontender, nondistended, + BS MS: no deformity or atrophy  Skin: warm and dry, no rash Neuro:  Strength and sensation are intact Psych: euthymic mood, full affect   EKG:  EKG is ordered today. The ekg ordered today demonstratesnormal sinus rhythm with diffuse ST segment elevation consistent with early repolarization  Recent Labs: No results found for  requested labs within last 8760 hours.    Lipid Panel    Component Value Date/Time   CHOL 248 (H) 02/19/2016 1136   TRIG 308 (H) 02/19/2016 1136   HDL 40 (L) 02/19/2016 1136   CHOLHDL 6.2 (H) 02/19/2016 1136   VLDL 62 (H) 02/19/2016 1136   LDLCALC 146 (H) 02/19/2016 1136      Wt Readings from Last 3 Encounters:  03/31/17 230 lb (104.3 kg)  08/08/16 229 lb (103.9 kg)  07/29/16 228 lb (103.4 kg)       No flowsheet data found.    ASSESSMENT AND PLAN:  1.  Essential hypertension: Blood pressure is not optimally controlled likely due to poor compliance with medications.  I had a prolonged discussion with him about the importance of taking his medications regularly.  I encouraged him to continue with regular exercise.  I refilled his medications.   2. Hyperlipidemia: Lipid profile last year showed elevated LDL and triglyceride.  LDL was 146 with a triglyceride of 308.  I again discussed with him the importance of healthy diet and continued exercise.  3. Obesity:  he is more active and exercising over the last 6 months but has not been able to lose any weight.  4.  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     Disposition:   FU with me in 1 year  Signed,  Lorine Bears, MD  03/31/2017 10:54 AM    [facility]

## 2018-04-06 ENCOUNTER — Ambulatory Visit: Payer: 59 | Admitting: Cardiovascular Disease

## 2018-04-06 ENCOUNTER — Encounter: Payer: Self-pay | Admitting: Cardiovascular Disease

## 2018-04-06 VITALS — BP 146/94 | HR 78 | Ht 69.0 in | Wt 236.2 lb

## 2018-04-06 DIAGNOSIS — I10 Essential (primary) hypertension: Secondary | ICD-10-CM | POA: Diagnosis not present

## 2018-04-06 DIAGNOSIS — E785 Hyperlipidemia, unspecified: Secondary | ICD-10-CM | POA: Diagnosis not present

## 2018-04-06 LAB — BASIC METABOLIC PANEL
BUN/Creatinine Ratio: 7 — ABNORMAL LOW (ref 9–20)
BUN: 7 mg/dL (ref 6–20)
CALCIUM: 9.4 mg/dL (ref 8.7–10.2)
CO2: 20 mmol/L (ref 20–29)
Chloride: 105 mmol/L (ref 96–106)
Creatinine, Ser: 1.04 mg/dL (ref 0.76–1.27)
GFR calc Af Amer: 104 mL/min/{1.73_m2} (ref >59)
GFR, EST NON AFRICAN AMERICAN: 90 mL/min/{1.73_m2} (ref >59)
GLUCOSE: 122 mg/dL — AB (ref 65–99)
Potassium: 4.2 mmol/L (ref 3.5–5.2)
SODIUM: 141 mmol/L (ref 134–144)

## 2018-04-06 LAB — HEPATIC FUNCTION PANEL
ALBUMIN: 4.5 g/dL (ref 4.0–5.0)
ALT: 46 IU/L — ABNORMAL HIGH (ref 0–44)
AST: 26 IU/L (ref 0–40)
Alkaline Phosphatase: 102 IU/L (ref 39–117)
BILIRUBIN TOTAL: 0.4 mg/dL (ref 0.0–1.2)
Bilirubin, Direct: 0.11 mg/dL (ref 0.00–0.40)
TOTAL PROTEIN: 6.8 g/dL (ref 6.0–8.5)

## 2018-04-06 LAB — LIPID PANEL
CHOL/HDL RATIO: 5.8 ratio — AB (ref 0.0–5.0)
Cholesterol, Total: 242 mg/dL — ABNORMAL HIGH (ref 100–199)
HDL: 42 mg/dL (ref >39)
LDL CALC: 166 mg/dL — AB (ref 0–99)
TRIGLYCERIDES: 168 mg/dL — AB (ref 0–149)
VLDL Cholesterol Cal: 34 mg/dL (ref 5–40)

## 2018-04-06 MED ORDER — AMLODIPINE BESYLATE 10 MG PO TABS: 10.0000 mg | ORAL_TABLET | Freq: Every day | ORAL | 3 refills | Status: DC

## 2018-04-06 MED ORDER — LISINOPRIL 40 MG PO TABS: 40.0000 mg | ORAL_TABLET | Freq: Every day | ORAL | 3 refills | Status: DC

## 2018-04-06 NOTE — Progress Notes (Signed)
Cardiology Office Note   Date:  04/06/2018   ID:  Huxlee, Rago [birth year], MRN [id number]  PCP:  Wendee Beavers, DO  Cardiologist:   Lorine Bears, MD   Chief Complaint  Patient presents with  . Follow-up    pt denied chest pain      History of Present Illness: Andres Ortiz is a 38 y.o.  African-American male who presents for a follow-up visit regarding essential hypertension. The patient is known to have an abnormal EKG going back to 2007 when he was seen by Dr. Gala Romney. He underwent cardiac catheterization due to significant anterior ST elevation which showed no significant coronary artery disease. His EKG demonstrates significant early repolarization.  He has known history of excessive alcohol use  and obesity He had an echocardiogram done in October 2015 which showed normal LV systolic function with moderate left ventricular hypertrophy. He has a strong family history of hypertension.  He has been doing reasonably well overall with no recent chest pain, shortness of breath or palpitations.  He reports taking his medications regularly.  He cut down on alcohol intake compared to before.  He continues to use marijuana but denies tobacco use.  He hurt his back few weeks ago and has been using ibuprofen.  His blood pressure is elevated today but he reports better readings at home.  Past Medical History:  Diagnosis Date  . BMI 34.0-34.9,adult   . Hypertension   . Irregular heart rate    "They told me to always mention it but I don't know what it was."  . Low back pain     Past Surgical History:  Procedure Laterality Date  . NO PAST SURGERIES       Current Outpatient Medications  Medication Sig Dispense Refill  . amLODipine (NORVASC) 10 MG tablet Take 1 tablet (10 mg total) by mouth daily. 90 tablet 3  . lisinopril (PRINIVIL,ZESTRIL) 40 MG tablet Take 1 tablet (40 mg total) by mouth daily. 90 tablet 3   No current facility-administered  medications for this visit.     Allergies:   Patient has no known allergies.    Social History:  The patient  reports that he quit smoking about 12 years ago. He has never used smokeless tobacco. He reports current alcohol use of about 5.0 standard drinks of alcohol per week. He reports current drug use. Drug: Marijuana.   Family History:  The patient's family history includes Arthritis in his maternal grandmother, maternal uncle, and mother; Diabetes in his maternal grandmother; Hypertension in his mother; Muscular dystrophy in his paternal aunt and paternal uncle.    ROS:  Please see the history of present illness.   Otherwise, review of systems are positive for none.   All other systems are reviewed and negative.    PHYSICAL EXAM: VS:  BP (!) 146/94   Pulse 78   Ht 5\' 9"  (1.753 m)   Wt 236 lb 3.2 oz (107.1 kg)   BMI 34.88 kg/m  , BMI Body mass index is 34.88 kg/m. GEN: Well nourished, well developed, in no acute distress  HEENT: normal  Neck: no JVD, carotid bruits, or masses Cardiac: RRR; no murmurs, rubs, or gallops,no edema  Respiratory:  clear to auscultation bilaterally, normal work of breathing GI: soft, nontender, nondistended, + BS MS: no deformity or atrophy  Skin: warm and dry, no rash Neuro:  Strength and sensation are intact Psych: euthymic mood, full affect   EKG:  EKG is  ordered today. The ekg ordered today demonstratesnormal sinus rhythm with diffuse ST segment elevation consistent with early repolarization  Recent Labs: No results found for requested labs within last 8760 hours.    Lipid Panel    Component Value Date/Time   CHOL 248 (H) 02/19/2016 1136   TRIG 308 (H) 02/19/2016 1136   HDL 40 (L) 02/19/2016 1136   CHOLHDL 6.2 (H) 02/19/2016 1136   VLDL 62 (H) 02/19/2016 1136   LDLCALC 146 (H) 02/19/2016 1136      Wt Readings from Last 3 Encounters:  04/06/18 236 lb 3.2 oz (107.1 kg)  03/31/17 230 lb (104.3 kg)  08/08/16 229 lb (103.9 kg)        No flowsheet data found.    ASSESSMENT AND PLAN:  1.  Essential hypertension: Blood pressure has been controlled at home.  It is mildly elevated today but he has been taking ibuprofen for his back.  I advised him to switch to acetaminophen. I again had a discussion with him about the importance of healthy lifestyle changes. I refilled his medications.   2. Hyperlipidemia: Lipid profile last year showed elevated LDL and triglyceride.  LDL was 146 with a triglyceride of 308.  I again discussed with him the importance of healthy diet and continued exercise.  I am going to repeat his lipid profile today and consider medications if needed.  3. Obesity:  he is more active and exercising over the last 6 months but has not been able to lose any weight.  4.  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    Disposition:   FU with me in 1 year  Signed,  Lorine Bears, MD  04/06/2018 9:24 AM    [facility]

## 2018-04-06 NOTE — Patient Instructions (Signed)
Medication Instructions:  No changes If you need a refill on your cardiac medications before your next appointment, please call your pharmacy.   Lab work: Your provider would like for you to have the following labs today: BMET. Lipid and Liver  If you have labs (blood work) drawn today and your tests are completely normal, you will receive your results only by: Marland Kitchen MyChart Message (if you have MyChart) OR . A paper copy in the mail If you have any lab test that is abnormal or we need to change your treatment, we will call you to review the results.  Testing/Procedures: None ordered  Follow-Up: At [facility], you and your health needs are our priority.  As part of our continuing mission to provide you with exceptional heart care, we have created designated Provider Care Teams.  These Care Teams include your primary Cardiologist (physician) and Advanced Practice Providers (APPs -  Physician Assistants and Nurse Practitioners) who all work together to provide you with the care you need, when you need it. You will need a follow up appointment in 12 months.  Please call our office 2 months in advance to schedule this appointment.  You may see Dr. Kirke Corin or one of the following Advanced Practice Providers on your designated Care Team:   Corine Shelter, PA-C Judy Pimple, [place] . Marjie Skiff, PA-C

## 2018-04-09 ENCOUNTER — Telehealth: Payer: Self-pay | Admitting: *Deleted

## 2018-04-09 DIAGNOSIS — E785 Hyperlipidemia, unspecified: Secondary | ICD-10-CM

## 2018-04-09 MED ORDER — ATORVASTATIN CALCIUM 10 MG PO TABS: 10.0000 mg | ORAL_TABLET | Freq: Every day | ORAL | 3 refills | Status: DC

## 2018-04-09 NOTE — Telephone Encounter (Signed)
-----   Message from Iran Ouch, MD sent at 04/08/2018  2:29 PM EDT ----- Inform patient that labs were normal. Cholesterol was worse than before.  I recommend adding atorvastatin 10 mg daily.  Repeat lipid and liver profile in 2 months.  He needs to work on [employer] and exercise.

## 2018-04-09 NOTE — Telephone Encounter (Signed)
Patient made aware of results and verbalized understanding.  Atorvastatin 10 mg tablet has been sent into [facility] and repeat fasting lab orders have been placed.

## 2018-09-17 ENCOUNTER — Other Ambulatory Visit: Payer: Self-pay | Admitting: Cardiovascular Disease

## 2018-09-17 DIAGNOSIS — I10 Essential (primary) hypertension: Secondary | ICD-10-CM

## 2019-02-28 ENCOUNTER — Other Ambulatory Visit: Payer: Self-pay | Admitting: Cardiovascular Disease

## 2019-02-28 DIAGNOSIS — I10 Essential (primary) hypertension: Secondary | ICD-10-CM

## 2019-02-28 NOTE — Telephone Encounter (Signed)
Refill request

## 2019-05-27 ENCOUNTER — Other Ambulatory Visit: Payer: Self-pay | Admitting: Cardiovascular Disease

## 2019-05-27 DIAGNOSIS — I10 Essential (primary) hypertension: Secondary | ICD-10-CM

## 2019-05-27 NOTE — Telephone Encounter (Signed)
Refill request

## 2019-06-14 ENCOUNTER — Ambulatory Visit: Payer: 59 | Admitting: Cardiovascular Disease

## 2019-06-14 ENCOUNTER — Encounter: Payer: Self-pay | Admitting: Cardiovascular Disease

## 2019-06-14 ENCOUNTER — Other Ambulatory Visit: Payer: Self-pay

## 2019-06-14 VITALS — BP 150/78 | HR 79 | Temp 97.0°F | Ht 68.5 in | Wt 231.0 lb

## 2019-06-14 DIAGNOSIS — I10 Essential (primary) hypertension: Secondary | ICD-10-CM | POA: Diagnosis not present

## 2019-06-14 DIAGNOSIS — E785 Hyperlipidemia, unspecified: Secondary | ICD-10-CM

## 2019-06-14 MED ORDER — LISINOPRIL 40 MG PO TABS: ORAL_TABLET | ORAL | 3 refills | Status: DC

## 2019-06-14 MED ORDER — AMLODIPINE BESYLATE 10 MG PO TABS: 10.0000 mg | ORAL_TABLET | Freq: Every day | ORAL | 3 refills | Status: DC

## 2019-06-14 NOTE — Progress Notes (Signed)
Cardiology Office Note   Date:  06/14/2019   ID:  Andres, Ortiz 01/25/1981, MRN [id number]  PCP:  Myrene Buddy, MD  Cardiologist:   Lorine Bears, MD   No chief complaint on file.     History of Present Illness: Andres Ortiz is a 39 y.o.  African-American male who presents for a follow-up visit regarding essential hypertension. The patient is known to have an abnormal EKG going back to 2007 when he was seen by Dr. Gala Romney. He underwent cardiac catheterization due to significant anterior ST elevation which showed no significant coronary artery disease. His EKG demonstrates significant early repolarization.  He has known history of excessive alcohol use  and obesity He had an echocardiogram done in October 2015 which showed normal LV systolic function with moderate left ventricular hypertrophy. He has a strong family history of hypertension.  He has history of excessive alcohol use but reports quitting 2 months ago.  No tobacco use.  He continues to smoke marijuana on the weekends.  He works in Production designer, theatre/television/film.  He was prescribed atorvastatin last year but stopped the medication because it made him feel funny.  He continues to take amlodipine and lisinopril for high blood pressure.  He went to urgent care recently for right upper quadrant pain and was told about abnormalities to the liver and left lung.  These results are not available.  However, the patient did establish with a primary care physician at [facility] and he is getting more testing for that. He denies chest pain or shortness of breath.  Past Medical History:  Diagnosis Date  . BMI 34.0-34.9,adult   . Hypertension   . Irregular heart rate    "They told me to always mention it but I don't know what it was."  . Low back pain     Past Surgical History:  Procedure Laterality Date  . NO PAST SURGERIES       Current Outpatient Medications  Medication Sig Dispense Refill  . amLODipine (NORVASC) 10 MG  tablet Take 1 tablet (10 mg total) by mouth daily. 90 tablet 3  . lisinopril (ZESTRIL) 40 MG tablet TAKE 1 TABLET BY MOUTH DAILY. PLEASE MAKE ANNUAL APPT WITH DR. Kirke Corin FOR REFILLS. [phone]. 90 tablet 1   No current facility-administered medications for this visit.    Allergies:   Patient has no known allergies.    Social History:  The patient  reports that he quit smoking about 13 years ago. He has never used smokeless tobacco. He reports current alcohol use of about 5.0 standard drinks of alcohol per week. He reports current drug use. Drug: Marijuana.   Family History:  The patient's family history includes Arthritis in his maternal grandmother, maternal uncle, and mother; Diabetes in his maternal grandmother; Hypertension in his mother; Muscular dystrophy in his paternal aunt and paternal uncle.    ROS:  Please see the history of present illness.   Otherwise, review of systems are positive for none.   All other systems are reviewed and negative.    PHYSICAL EXAM: VS:  BP (!) 150/78   Pulse 79   Temp (!) 97 F (36.1 C)   Ht 5' 8.5" (1.74 m)   Wt 231 lb (104.8 kg)   SpO2 95%   BMI 34.61 kg/m  , BMI Body mass index is 34.61 kg/m. GEN: Well nourished, well developed, in no acute distress  HEENT: normal  Neck: no JVD, carotid bruits, or masses Cardiac: RRR; no murmurs, rubs,  or gallops,no edema  Respiratory:  clear to auscultation bilaterally, normal work of breathing GI: soft, nontender, nondistended, + BS MS: no deformity or atrophy  Skin: warm and dry, no rash Neuro:  Strength and sensation are intact Psych: euthymic mood, full affect   EKG:  EKG is ordered today. The ekg ordered today demonstratesnormal sinus rhythm with diffuse ST segment elevation consistent with early repolarization.  The elevation is more pronounced in the lateral leads with ST depression in lead III and aVF.  Recent Labs: No results found for requested labs within last 8760 hours.    Lipid  Panel    Component Value Date/Time   CHOL 242 (H) 04/06/2018 0954   TRIG 168 (H) 04/06/2018 0954   HDL 42 04/06/2018 0954   CHOLHDL 5.8 (H) 04/06/2018 0954   CHOLHDL 6.2 (H) 02/19/2016 1136   VLDL 62 (H) 02/19/2016 1136   LDLCALC 166 (H) 04/06/2018 0954      Wt Readings from Last 3 Encounters:  06/14/19 231 lb (104.8 kg)  04/06/18 236 lb 3.2 oz (107.1 kg)  03/31/17 230 lb (104.3 kg)       No flowsheet data found.    ASSESSMENT AND PLAN:  1.  Essential hypertension: Blood pressure is mildly elevated today but he reports being anxious.  His blood pressure seems to be more controlled at home and he reports that blood pressure yesterday was 128/70.  Based on that, I elected not to make any changes.  Continue lisinopril and amlodipine.     2.  mixed hyperlipidemia: The patient was prescribed atorvastatin last year but stopped the medication on his own due to unspecified side effects.  3.  Abnormal EKG: The patient has known history of highly abnormal EKG likely due to underlying hypertensive heart disease.  Cardiac catheterization in 2007 showed no significant coronary artery disease and echocardiogram in 2015 showed moderate LVH with normal EF.  I strongly advised him to continue follow-up with his primary care physician regarding recent abnormalities found at urgent care.    Disposition:   FU with me in 1 year  Signed,  Kathlyn Sacramento, MD  06/14/2019 10:41 AM    [place]

## 2019-06-14 NOTE — Patient Instructions (Signed)

## 2019-06-15 ENCOUNTER — Other Ambulatory Visit: Payer: Self-pay | Admitting: Family Medicine

## 2019-06-15 DIAGNOSIS — K769 Liver disease, unspecified: Secondary | ICD-10-CM

## 2019-06-16 ENCOUNTER — Other Ambulatory Visit: Payer: Self-pay | Admitting: Family Medicine

## 2019-06-16 DIAGNOSIS — R911 Solitary pulmonary nodule: Secondary | ICD-10-CM

## 2019-06-22 ENCOUNTER — Ambulatory Visit
Admission: RE | Admit: 2019-06-22 | Discharge: 2019-06-22 | Disposition: A | Discharge status: Home / Self Care | Payer: 59 | Source: Ambulatory Visit | Attending: Family Medicine | Admitting: Family Medicine

## 2019-06-22 DIAGNOSIS — K769 Liver disease, unspecified: Secondary | ICD-10-CM

## 2019-07-06 ENCOUNTER — Other Ambulatory Visit: Payer: Self-pay | Admitting: Family Medicine

## 2019-07-08 ENCOUNTER — Ambulatory Visit
Admission: RE | Admit: 2019-07-08 | Discharge: 2019-07-08 | Disposition: A | Discharge status: Home / Self Care | Payer: 59 | Source: Ambulatory Visit | Attending: Family Medicine | Admitting: Family Medicine

## 2019-07-08 DIAGNOSIS — R911 Solitary pulmonary nodule: Secondary | ICD-10-CM

## 2020-01-06 ENCOUNTER — Emergency Department (HOSPITAL_COMMUNITY)
Admission: EM | Admit: 2020-01-06 | Discharge: 2020-01-06 | Disposition: A | Discharge status: Home / Self Care | Payer: 59 | Attending: Emergency Medicine | Admitting: Emergency Medicine

## 2020-01-06 ENCOUNTER — Other Ambulatory Visit: Payer: Self-pay

## 2020-01-06 ENCOUNTER — Emergency Department (HOSPITAL_COMMUNITY): Payer: 59

## 2020-01-06 ENCOUNTER — Encounter (HOSPITAL_COMMUNITY): Payer: Self-pay

## 2020-01-06 DIAGNOSIS — U071 COVID-19: Secondary | ICD-10-CM | POA: Insufficient documentation

## 2020-01-06 DIAGNOSIS — Z87891 Personal history of nicotine dependence: Secondary | ICD-10-CM | POA: Insufficient documentation

## 2020-01-06 DIAGNOSIS — R0602 Shortness of breath: Secondary | ICD-10-CM | POA: Diagnosis present

## 2020-01-06 DIAGNOSIS — I119 Hypertensive heart disease without heart failure: Secondary | ICD-10-CM | POA: Diagnosis not present

## 2020-01-06 DIAGNOSIS — Z79899 Other long term (current) drug therapy: Secondary | ICD-10-CM | POA: Diagnosis not present

## 2020-01-06 LAB — RESP PANEL BY RT-PCR (FLU A&B, COVID) ARPGX2
Influenza A by PCR: NEGATIVE
Influenza B by PCR: NEGATIVE
SARS Coronavirus 2 by RT PCR: POSITIVE — AB

## 2020-01-06 MED ORDER — ALBUTEROL SULFATE HFA 108 (90 BASE) MCG/ACT IN AERS
2.0000 | INHALATION_SPRAY | Freq: Once | RESPIRATORY_TRACT | Status: AC
  Administered 2020-01-06: 2 via RESPIRATORY_TRACT
  Filled 2020-01-06: qty 6.7

## 2020-01-06 MED ORDER — BENZONATATE 100 MG PO CAPS
100.0000 mg | ORAL_CAPSULE | Freq: Three times a day (TID) | ORAL | 0 refills | Status: AC
Start: 2020-01-06 — End: 2020-01-11

## 2020-01-06 NOTE — ED Triage Notes (Signed)
covid + on 12/30/2019. Increased shob and dry cough since 01/01/2020. Per patient MD sent for chest xray.

## 2020-01-06 NOTE — ED Provider Notes (Signed)
[facility]   CSN: [id number] Arrival date & time: 01/06/20  1623     History Chief Complaint  Patient presents with  . Covid Positive  . Cough  . Shortness of Breath    Andres Ortiz is a 39 y.o. male.  HPI   Pt is a 39 y/o male with a h/o HTN, who presents to the ED today for eval of COVID sxs. States he tested positive for COVID 1 week ago. He reports that he has had fevers, chills, body aches and over the last few days he has had a worsened cough and now has some shortness of breath. He states he tried to make an appt with his pcp and was sent here for a CXR. Denies chest pain. Had some diarrhea which is improved now.   Past Medical History:  Diagnosis Date  . BMI 34.0-34.9,adult   . Hypertension   . Irregular heart rate    "They told me to always mention it but I don't know what it was."  . Low back pain     Patient Active Problem List   Diagnosis Date Noted  . Hypertensive heart disease 11/14/2014  . Primary hypertension 10/13/2013  . Nonspecific abnormal electrocardiogram (ECG) (EKG) 10/13/2013  . Chronic bilateral thoracic back pain 10/13/2013  . Marijuana use 10/13/2013  . BMI 34.0-34.9,adult 10/13/2013    Past Surgical History:  Procedure Laterality Date  . NO PAST SURGERIES         Family History  Problem Relation Age of Onset  . Hypertension Mother   . Arthritis Mother   . Arthritis Maternal Uncle   . Muscular dystrophy Paternal Aunt   . Arthritis Maternal Grandmother   . Diabetes Maternal Grandmother   . Muscular dystrophy Paternal Uncle     Social History   Tobacco Use  . Smoking status: Former Smoker    Quit date: 01/27/2006    Years since quitting: 13.9  . Smokeless tobacco: Never Used  Substance Use Topics  . Alcohol use: Yes    Alcohol/week: 5.0 standard drinks    Types: 5 Shots of liquor per week  . Drug use: Yes    Types: Marijuana    Comment: daily marijuana use as of  10/13/13    Home Medications Prior to Admission medications   Medication Sig Start Date End Date Taking? Authorizing Provider  amLODipine (NORVASC) 10 MG tablet Take 1 tablet (10 mg total) by mouth daily. 06/14/19   Iran Ouch, MD  benzonatate (TESSALON) 100 MG capsule Take 1 capsule (100 mg total) by mouth every 8 (eight) hours for 5 days. 01/06/20 01/11/20  Marcelo Ickes S, PA-C  lisinopril (ZESTRIL) 40 MG tablet TAKE 1 TABLET BY MOUTH DAILY. 06/14/19   Iran Ouch, MD    Allergies    Patient has no known allergies.  Review of Systems   Review of Systems  Constitutional: Negative for fever.  HENT: Negative for ear pain and sore throat.   Eyes: Negative for visual disturbance.  Respiratory: Positive for cough and shortness of breath.   Cardiovascular: Negative for chest pain.  Gastrointestinal: Negative for abdominal pain, constipation, diarrhea, nausea and vomiting.  Genitourinary: Negative for dysuria and hematuria.  Musculoskeletal: Negative for back pain.  Skin: Negative for rash.  Neurological: Negative for headaches.  All other systems reviewed and are negative.  Physical Exam Updated Vital Signs BP 138/76   Pulse (!) 103   Temp 99.4 F (37.4 C) (  Oral)   Resp 20   Ht 5\' 8"  (1.727 m)   Wt 103.4 kg   SpO2 96%   BMI 34.67 kg/m   Physical Exam  Physical Exam Vitals and nursing note reviewed.  Constitutional:      Appearance: He is well-developed and well-nourished.  HENT:     Head: Normocephalic and atraumatic.  Eyes:     Conjunctiva/sclera: Conjunctivae normal.  Cardiovascular:     Rate and Rhythm: Normal rate and regular rhythm.     Heart sounds: Normal heart sounds. No murmur heard. Pulmonary:     Effort: Pulmonary effort is normal. No respiratory distress.     Breath sounds: Normal breath sounds. No decreased breath sounds or wheezing.  Abdominal:     Palpations: Abdomen is soft.     Tenderness: There is no abdominal tenderness.   Musculoskeletal:        General: No edema.     Cervical back: Neck supple.  Skin:    General: Skin is warm and dry.  Neurological:     Mental Status: He is alert.  Psychiatric:        Mood and Affect: Mood and affect normal.   ED Results / Procedures / Treatments   Labs (all labs ordered are listed, but only abnormal results are displayed) Labs Reviewed  RESP PANEL BY RT-PCR (FLU A&B, COVID) [id number]    EKG None  Radiology DG Chest Portable 1 View  Result Date: 01/06/2020 CLINICAL DATA:  Shortness of breath. EXAM: PORTABLE CHEST 1 VIEW COMPARISON:  October 21, 2013. FINDINGS: The heart size and mediastinal contours are within normal limits. Both lungs are clear. No pneumothorax or pleural effusion is noted. The visualized skeletal structures are unremarkable. IMPRESSION: No active disease. Electronically Signed   By: October 23, 2013 M.D.   On: 01/06/2020 18:44    Procedures Procedures (including critical care time)  Medications Ordered in ED Medications  albuterol (VENTOLIN HFA) 108 (90 Base) MCG/ACT inhaler 2 puff (2 puffs Inhalation Given 01/06/20 1754)    ED Course  I have reviewed the triage vital signs and the nursing notes.  Pertinent labs & imaging results that were available during my care of the patient were reviewed by me and considered in my medical decision making (see chart for details).    MDM Rules/Calculators/A&P                          Patient presenting for evaluation for Covid.  Reports symptoms ongoing for 7 days.  Patient nontoxic, well-appearing, no distress.  Vital signs are reassuring.  Chest x-ray reassuring.  covid was positive as outpatient  Advised on quarantine measures. Will give Rx for symptomatic management. Have reached out to [facility] to set up infusion. Pt declined infusion in the ED. Advised on f/u and return precautions. Pt voiced understanding of the plan and reasons to return. All questions answered, pt stable for d/c.  Final  Clinical Impression(s) / ED Diagnoses Final diagnoses:  Shortness of breath  COVID    Rx / DC Orders ED Discharge Orders         Ordered    benzonatate (TESSALON) 100 MG capsule  Every 8 hours        01/06/20 2014           14/10/21, PA-C 01/06/20 2235    2236, DO 01/07/20 0004

## 2020-01-06 NOTE — ED Notes (Addendum)
Ambulated pt in room O2 99 and pulse 107. Reported to nurse.

## 2020-01-06 NOTE — ED Notes (Signed)
Received a call from lab post-discharge of patient.  Date and time results received: 01/06/20 11:44 PM  (use smartphrase ".now" to insert current time)  Test: COVID Critical Value: Positive  Name of Provider Notified: Horton, MD; Courtni, PA  Orders Received? Or Actions Taken?: Orders Received - See Orders for details

## 2020-01-06 NOTE — Discharge Instructions (Signed)
Take two puffs of the albuterol inhaler every 4 hours as needed for cough, shortness of breath, or wheezing.   Take the cough medication as directed.   Make sure to stay well hydrated.   You should be isolated for at least 7 days since the onset of your symptoms AND >72 hours after symptoms resolution (absence of fever without the use of fever reducing medication and improvement in respiratory symptoms), whichever is longer  Please follow up with your primary care provider within 5-7 days for re-evaluation of your symptoms. If you do not have a primary care provider, information for a healthcare clinic has been provided for you to make arrangements for follow up care. Please return to the emergency department for any new or worsening symptoms including shortness of breath or chest pain.

## 2020-01-07 ENCOUNTER — Telehealth: Payer: Self-pay | Admitting: Physician Assistant

## 2020-01-07 NOTE — Telephone Encounter (Signed)
Called to Discuss with patient about Covid symptoms and the use of the monoclonal antibody infusion for those with mild to moderate Covid symptoms and at a high risk of hospitalization.     Pt is qualified for this infusion due to co-morbid conditions and/or a member of an at-risk group.     Patient Active Problem List   Diagnosis Date Noted  . Hypertensive heart disease 11/14/2014  . Primary hypertension 10/13/2013  . Nonspecific abnormal electrocardiogram (ECG) (EKG) 10/13/2013  . Chronic bilateral thoracic back pain 10/13/2013  . Marijuana use 10/13/2013  . BMI 34.0-34.9,adult 10/13/2013    Patient declines infusion at this time. Symptoms tier reviewed as well as criteria for ending isolation. Preventative practices reviewed. Patient verbalized understanding.    Patient advised to call back if he/she decides that he/she does want to get infusion. Callback number to the infusion center given. Patient advised to go to Urgent care or ED with severe symptoms.

## 2020-05-28 ENCOUNTER — Other Ambulatory Visit: Payer: Self-pay | Admitting: Orthopedic Surgery

## 2020-05-28 DIAGNOSIS — R2232 Localized swelling, mass and lump, left upper limb: Secondary | ICD-10-CM

## 2020-06-10 ENCOUNTER — Other Ambulatory Visit: Payer: 59

## 2020-06-12 ENCOUNTER — Other Ambulatory Visit: Payer: Self-pay

## 2020-06-12 ENCOUNTER — Ambulatory Visit
Admission: RE | Admit: 2020-06-12 | Discharge: 2020-06-12 | Disposition: A | Discharge status: Home / Self Care | Payer: 59 | Source: Ambulatory Visit | Attending: Orthopedic Surgery | Admitting: Orthopedic Surgery

## 2020-06-12 DIAGNOSIS — R2232 Localized swelling, mass and lump, left upper limb: Secondary | ICD-10-CM

## 2020-06-12 MED ORDER — GADOBENATE DIMEGLUMINE 529 MG/ML IV SOLN
20.0000 mL | Freq: Once | INTRAVENOUS | Status: AC | PRN
  Administered 2020-06-12: 20 mL via INTRAVENOUS

## 2020-06-14 ENCOUNTER — Other Ambulatory Visit: Payer: Self-pay | Admitting: Cardiovascular Disease

## 2020-06-14 DIAGNOSIS — I10 Essential (primary) hypertension: Secondary | ICD-10-CM

## 2020-06-14 NOTE — Telephone Encounter (Signed)
Refill request

## 2020-06-20 ENCOUNTER — Other Ambulatory Visit: Payer: Self-pay

## 2020-06-20 DIAGNOSIS — I10 Essential (primary) hypertension: Secondary | ICD-10-CM

## 2020-06-20 MED ORDER — LISINOPRIL 40 MG PO TABS: ORAL_TABLET | ORAL | 0 refills | Status: DC

## 2020-07-19 ENCOUNTER — Other Ambulatory Visit: Payer: Self-pay | Admitting: Cardiovascular Disease

## 2020-07-19 DIAGNOSIS — I10 Essential (primary) hypertension: Secondary | ICD-10-CM

## 2020-07-19 NOTE — Telephone Encounter (Signed)
Refill request

## 2020-08-17 ENCOUNTER — Other Ambulatory Visit: Payer: Self-pay | Admitting: Cardiovascular Disease

## 2020-08-17 DIAGNOSIS — I10 Essential (primary) hypertension: Secondary | ICD-10-CM

## 2020-08-17 NOTE — Telephone Encounter (Signed)
Refill request

## 2020-09-05 ENCOUNTER — Other Ambulatory Visit: Payer: Self-pay | Admitting: Cardiovascular Disease

## 2020-09-05 DIAGNOSIS — I10 Essential (primary) hypertension: Secondary | ICD-10-CM

## 2020-09-05 NOTE — Telephone Encounter (Signed)
Please review for refill, Thanks !  

## 2020-12-18 ENCOUNTER — Ambulatory Visit: Payer: 59 | Admitting: Cardiovascular Disease

## 2021-01-16 ENCOUNTER — Other Ambulatory Visit: Payer: Self-pay | Admitting: Family Medicine

## 2021-01-16 ENCOUNTER — Other Ambulatory Visit: Payer: Self-pay | Admitting: Internal Medicine

## 2021-01-16 DIAGNOSIS — R519 Headache, unspecified: Secondary | ICD-10-CM

## 2021-01-24 ENCOUNTER — Other Ambulatory Visit: Payer: Self-pay

## 2021-01-24 ENCOUNTER — Ambulatory Visit
Admission: RE | Admit: 2021-01-24 | Discharge: 2021-01-24 | Disposition: A | Discharge status: Home / Self Care | Payer: 59 | Source: Ambulatory Visit | Attending: Family Medicine | Admitting: Family Medicine

## 2021-01-24 DIAGNOSIS — R519 Headache, unspecified: Secondary | ICD-10-CM

## 2021-02-26 ENCOUNTER — Encounter: Payer: Self-pay | Admitting: Cardiovascular Disease

## 2021-02-26 ENCOUNTER — Ambulatory Visit: Payer: 59 | Admitting: Cardiovascular Disease

## 2021-02-26 ENCOUNTER — Other Ambulatory Visit: Payer: Self-pay

## 2021-02-26 VITALS — BP 150/92 | HR 72 | Ht 68.5 in | Wt 227.8 lb

## 2021-02-26 DIAGNOSIS — R9431 Abnormal electrocardiogram [ECG] [EKG]: Secondary | ICD-10-CM

## 2021-02-26 DIAGNOSIS — E785 Hyperlipidemia, unspecified: Secondary | ICD-10-CM | POA: Diagnosis not present

## 2021-02-26 DIAGNOSIS — I10 Essential (primary) hypertension: Secondary | ICD-10-CM | POA: Diagnosis not present

## 2021-02-26 NOTE — Patient Instructions (Addendum)

## 2021-02-26 NOTE — Progress Notes (Signed)
Cardiology Office Note   Date:  02/26/2021   ID:  Andres Ortiz, [name] 1980/04/08, MRN [id number]  PCP:  Andres James, MD  Cardiologist:   Lorine Bears, MD   No chief complaint on file.     History of Present Illness: Andres Ortiz is a 41 y.o.  African-American male who presents for a follow-up visit regarding essential hypertension. The patient is known to have an abnormal EKG going back to 2007 when he was seen by Dr. Gala Romney. He underwent cardiac catheterization due to significant anterior ST elevation which showed no significant coronary artery disease. His EKG demonstrates significant early repolarization.  He has known history of excessive alcohol use  and obesity He had an echocardiogram done in October 2015 which showed normal LV systolic function with moderate left ventricular hypertrophy. He has a strong family history of hypertension.  He has history of excessive alcohol use but he cut down significantly and currently only drinks on occasions.he was most recently started on metoprolol by his primary care physician for uncontrolled hypertension.  He reports significant improvement since then.  He denies chest pain or shortness of breath.     Past Medical History:  Diagnosis Date   BMI 34.0-34.9,adult    Hypertension    Irregular heart rate    "They told me to always mention it but I don't know what it was."   Low back pain     Past Surgical History:  Procedure Laterality Date   NO PAST SURGERIES       Current Outpatient Medications  Medication Sig Dispense Refill   amLODipine (NORVASC) 10 MG tablet TAKE 1 TABLET BY MOUTH EVERY DAY 90 tablet 0   lisinopril (ZESTRIL) 40 MG tablet NEED OV. 30 tablet 4   METOPROLOL TARTRATE PO Take by mouth. Patient unsure of dosage     No current facility-administered medications for this visit.    Allergies:   Patient has no known allergies.    Social History:  The patient  reports that he quit smoking about  15 years ago. His smoking use included cigarettes. He has never used smokeless tobacco. He reports current alcohol use of about 5.0 standard drinks per week. He reports current drug use. Drug: Marijuana.   Family History:  The patient's family history includes Arthritis in his maternal grandmother, maternal uncle, and mother; Diabetes in his maternal grandmother; Hypertension in his mother; Muscular dystrophy in his paternal aunt and paternal uncle.    ROS:  Please see the history of present illness.   Otherwise, review of systems are positive for none.   All other systems are reviewed and negative.    PHYSICAL EXAM: VS:  BP (!) 150/92    Pulse 72    Ht 5' 8.5" (1.74 m)    Wt 227 lb 12.8 oz (103.3 kg)    SpO2 97%    BMI 34.13 kg/m  , BMI Body mass index is 34.13 kg/m. GEN: Well nourished, well developed, in no acute distress  HEENT: normal  Neck: no JVD, carotid bruits, or masses Cardiac: RRR; no murmurs, rubs, or gallops,no edema  Respiratory:  clear to auscultation bilaterally, normal work of breathing GI: soft, nontender, nondistended, + BS MS: no deformity or atrophy  Skin: warm and dry, no rash Neuro:  Strength and sensation are intact Psych: euthymic mood, full affect   EKG:  EKG is ordered today. The ekg ordered today demonstratesnormal sinus rhythm with diffuse ST segment elevation consistent with early  repolarization.  The elevation is more pronounced in the anterior lateral leads.  Recent Labs: No results found for requested labs within last 8760 hours.    Lipid Panel    Component Value Date/Time   CHOL 242 (H) 04/06/2018 0954   TRIG 168 (H) 04/06/2018 0954   HDL 42 04/06/2018 0954   CHOLHDL 5.8 (H) 04/06/2018 0954   CHOLHDL 6.2 (H) 02/19/2016 1136   VLDL 62 (H) 02/19/2016 1136   LDLCALC 166 (H) 04/06/2018 0954      Wt Readings from Last 3 Encounters:  02/26/21 227 lb 12.8 oz (103.3 kg)  01/06/20 228 lb (103.4 kg)  06/14/19 231 lb (104.8 kg)       No  flowsheet data found.    ASSESSMENT AND PLAN:  1.  Essential hypertension: Blood pressure is elevated today but he reports drinking alcohol last night at his cousin's party.  Continue current antihypertensive medications including amlodipine, lisinopril and metoprolol which was started by his primary care physician.  Consider switching metoprolol to carvedilol in the future if needed for more blood pressure control or adding a thiazide diuretic such as chlorthalidone.   I discussed the option of enrolling in renal denervation studies but he is not interested.   2.  mixed hyperlipidemia: The patient was on atorvastatin in the past but discontinued due to unspecified side effects.  3.  Abnormal EKG: The patient has known history of highly abnormal EKG likely due to underlying hypertensive heart disease.  Cardiac catheterization in 2007 showed no significant coronary artery disease and echocardiogram in 2015 showed moderate LVH with normal EF.  4.  Obesity: I discussed with him the importance of healthy diet and regular exercise.    Disposition:   FU with me in 1 year  Signed,  Lorine Bears, MD  02/26/2021 9:37 AM    [facility]

## 2021-06-16 ENCOUNTER — Other Ambulatory Visit: Payer: Self-pay | Admitting: Cardiovascular Disease

## 2021-06-16 DIAGNOSIS — I10 Essential (primary) hypertension: Secondary | ICD-10-CM

## 2021-07-18 IMAGING — MR MR WRIST*L* WO/W CM
10 series · 40 of 40 positions shown · IV contrast (multihance)
Comparison: None.

CLINICAL DATA: Palpable left wrist mass.

EXAM:
MR OF THE LEFT WRIST WITHOUT AND WITH CONTRAST
TECHNIQUE: Multiplanar multisequence MR imaging of the left wrist was performed
both before and after the administration of intravenous contrast.
CONTRAST:  20mL MULTIHANCE GADOBENATE DIMEGLUMINE 529 MG/ML IV SOLN

[Series 7: T1 · coronal · left · 3.5mm · 0.29mm/px · 4 of 18 slices shown (1 of 2)]
[im 1/18]
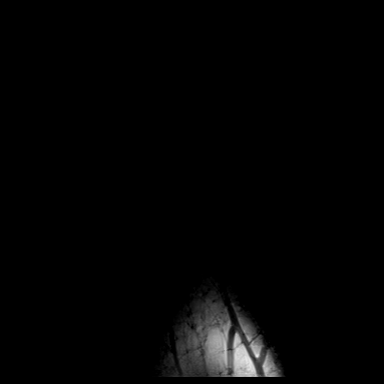
[im 6/18]
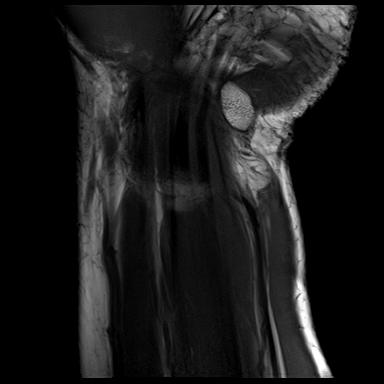
[im 12/18]
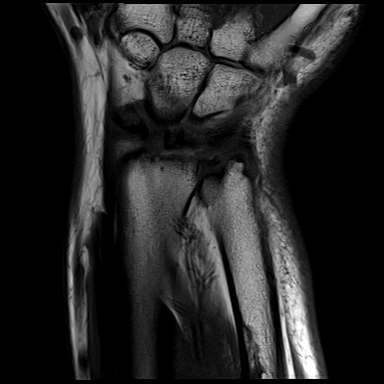
[im 18/18]
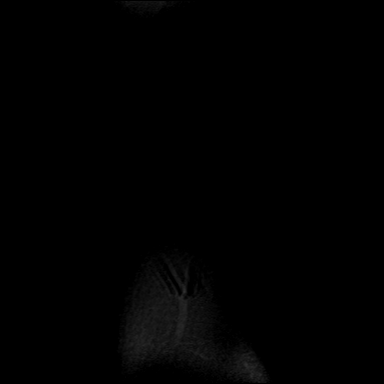

[Series 8: T2 fat-sat · coronal · left · 3.5mm · 0.31mm/px · 3 of 18 slices shown (1 of 2)]
[im 1/18]
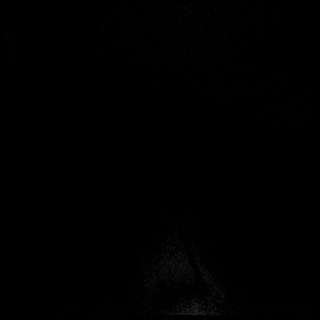
[im 9/18]
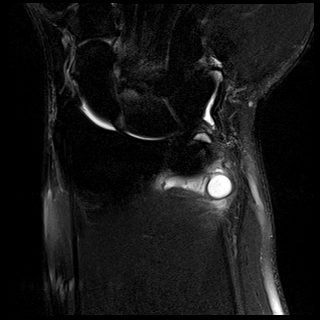
[im 18/18]
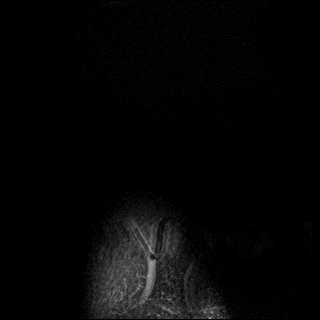

[Series 9: STIR · coronal · left · 3.5mm · 0.31mm/px · 3 of 17 slices shown]
[im 1/17]
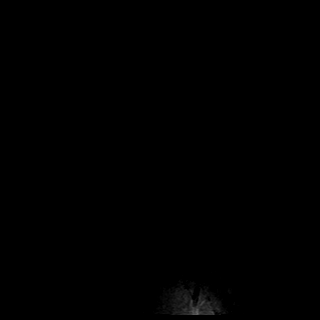
[im 9/17]
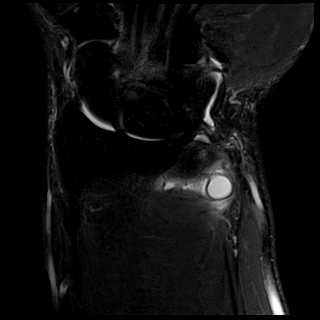
[im 17/17]
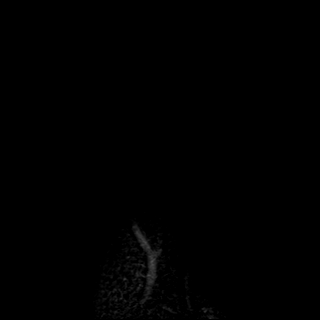

[Series 10: PD fat-sat · coronal · left · 3.5mm · 0.31mm/px · 3 of 18 slices shown (1 of 2)]
[im 1/18]
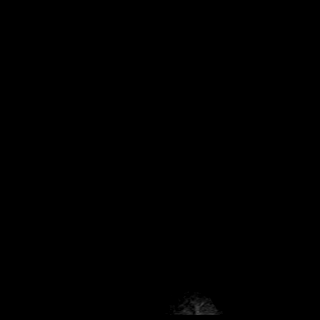
[im 9/18]
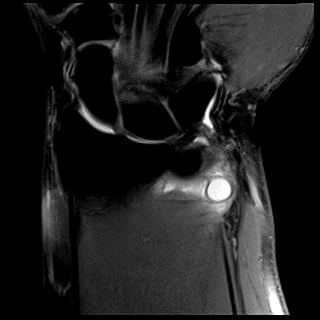
[im 18/18]
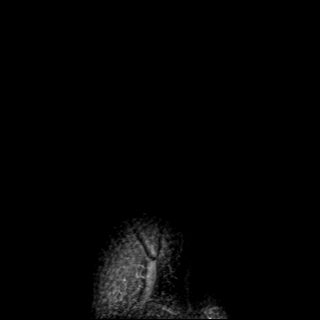

[Series 11: PD fat-sat · sagittal · left · 3.0mm · 0.31mm/px · 4 of 24 slices shown (2 of 2)]
[im 1/24]
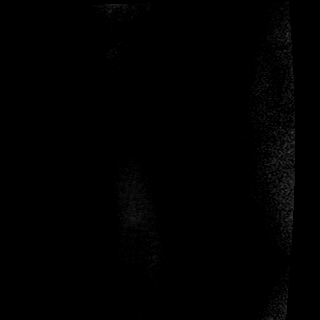
[im 8/24]
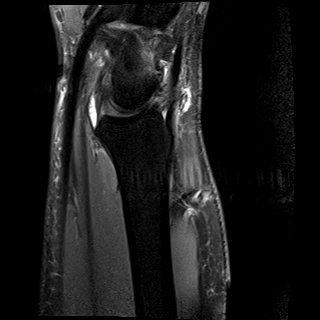
[im 16/24]
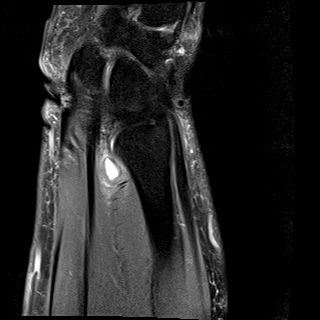
[im 24/24]
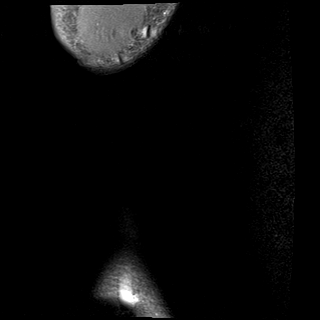

[Series 13: T1 fat-sat post-contrast · coronal · left · 3.5mm · 0.31mm/px · 3 of 18 slices shown (1 of 2)]
[im 1/18]
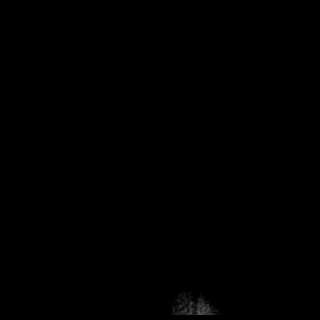
[im 9/18]
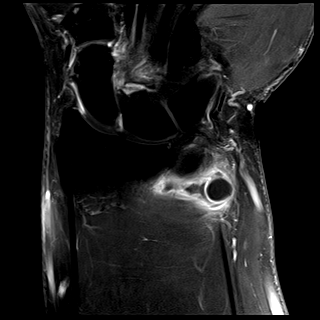
[im 18/18]
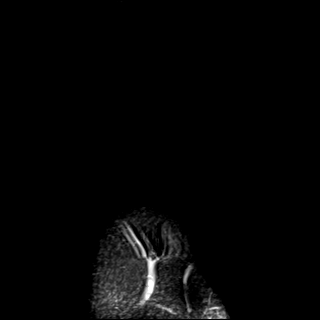

[Series 101: T1 · axial · left · 3.0mm · 0.31mm/px · z∈[-116,-5]mm · 5 of 33 slices shown (2 of 2)]
[im 1/33]
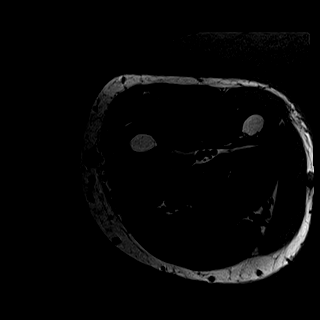
[im 9/33]
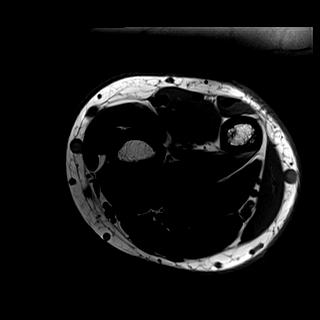
[im 17/33]
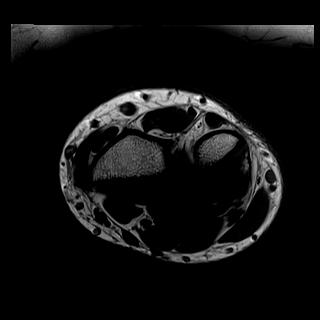
[im 25/33]
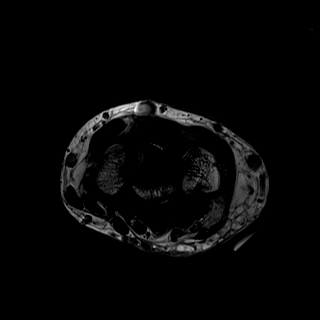
[im 33/33]
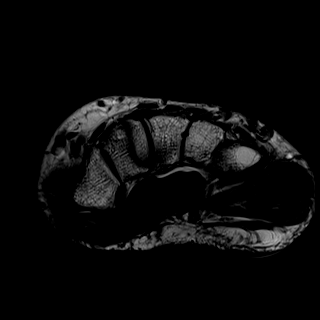

[Series 102: axial t1fs pre · axial · non-contrast · left · 3.0mm · 0.31mm/px · z∈[-107,+4]mm · 5 of 33 slices shown]
[im 1/33]
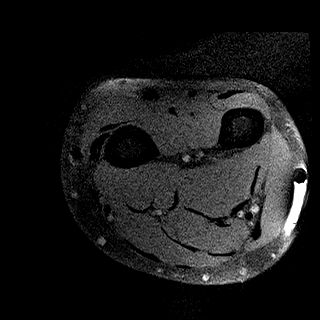
[im 9/33]
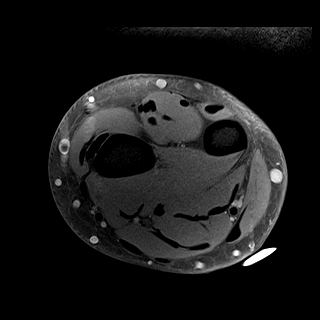
[im 17/33]
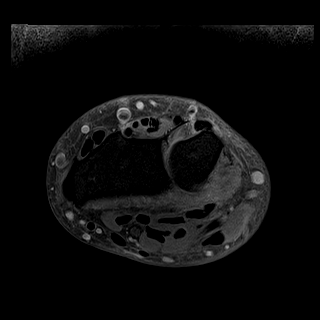
[im 25/33]
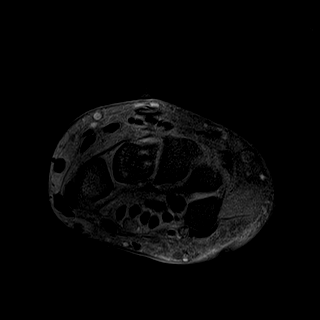
[im 33/33]
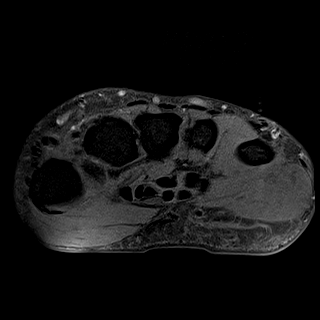

[Series 103: T1 fat-sat post-contrast · axial · left · 3.0mm · 0.31mm/px · z∈[-107,+4]mm · 5 of 33 slices shown (2 of 2)]
[im 1/33]
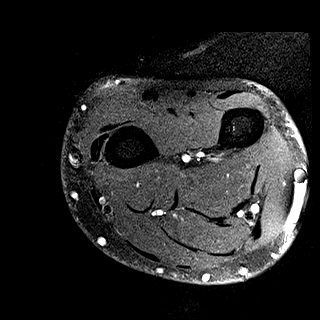
[im 9/33]
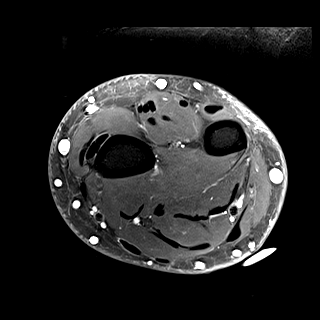
[im 17/33]
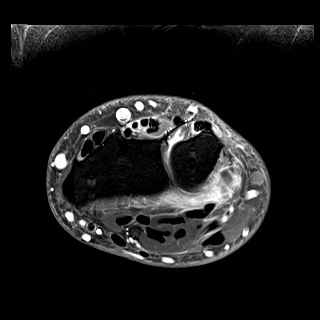
[im 25/33]
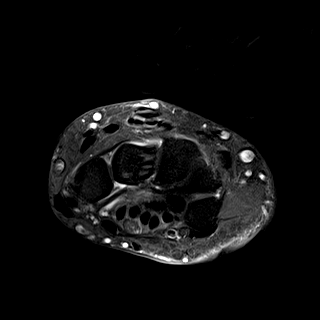
[im 33/33]
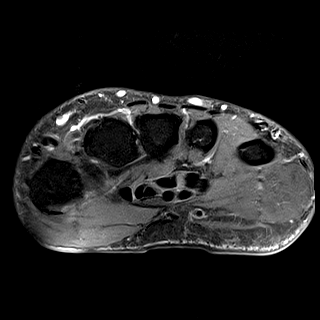

[Series 104: T2 fat-sat · axial · left · 3.0mm · 0.31mm/px · z∈[-116,-5]mm · 5 of 33 slices shown (2 of 2)]
[im 1/33]
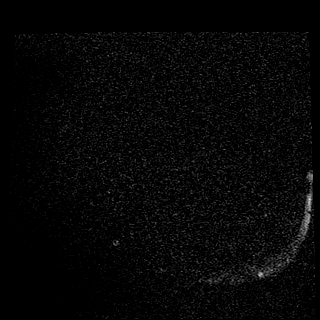
[im 9/33]
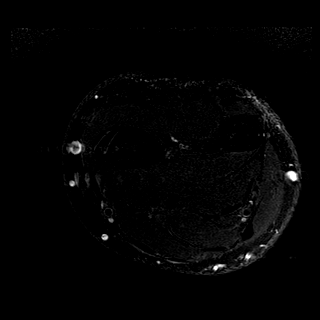
[im 17/33]
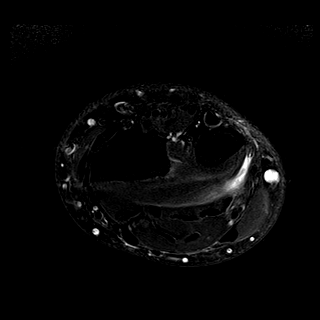
[im 25/33]
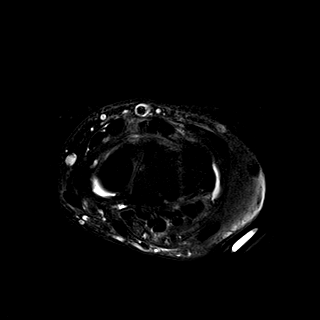
[im 33/33]
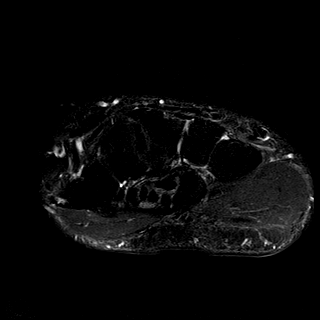

[40 of 40 positions shown; findings below may reference images not displayed]

FINDINGS: Ligaments: The intercarpal ligaments are intact. The intercarpal
joint spaces are maintained.

Triangular fibrocartilage: Intact.

Tendons: The dorsal wrist tendons are intact. No tendinopathy or
tenosynovitis.

Carpal tunnel/median nerve: The carpal tunnel tendons are intact. No
tendinopathy or tenosynovitis. No carpal tunnel mass or ganglion
cyst. The median nerve appears normal.

Guyon's canal: Normal appearance of the ulnar nerve.

Joint/cartilage: Normal articular cartilage. No joint effusion or
synovitis.

Bones/carpal alignment: Normal carpal alignment. No acute bony
findings. A few scattered carpal cysts are noted.

Other: The patient's palpable abnormality corresponds to a ganglion
cyst located along the palmar and ulnar aspect of the wrist. This is
wrapping around the palmar aspect of the ulnar neck it measures a
maximum of 2.2 x 1.3 cm. It appears to emanate from the radioulnar
joint and then dissects along the palmar aspect of the ulna. There
is some mild associated inflammation in the pronator quadratus
muscle.
IMPRESSION: 1. The patient's palpable abnormality corresponds to a ganglion cyst
located along the palmar and ulnar aspect of the wrist.
2. No acute bony findings or  degenerative changes.
3. Intact intercarpal ligaments and TFCC.

## 2021-09-10 ENCOUNTER — Ambulatory Visit: Payer: 59 | Admitting: Internal Medicine

## 2021-09-10 ENCOUNTER — Encounter: Payer: Self-pay | Admitting: Internal Medicine

## 2021-09-10 VITALS — BP 172/104 | HR 85 | Temp 97.8°F | Resp 16 | Ht 68.5 in | Wt 227.8 lb

## 2021-09-10 DIAGNOSIS — E785 Hyperlipidemia, unspecified: Secondary | ICD-10-CM | POA: Insufficient documentation

## 2021-09-10 DIAGNOSIS — K760 Fatty (change of) liver, not elsewhere classified: Secondary | ICD-10-CM

## 2021-09-10 DIAGNOSIS — I10 Essential (primary) hypertension: Secondary | ICD-10-CM

## 2021-09-10 MED ORDER — CHLORTHALIDONE 25 MG PO TABS: 25.0000 mg | ORAL_TABLET | Freq: Every day | ORAL | 2 refills | Status: DC

## 2021-09-10 MED ORDER — ROSUVASTATIN CALCIUM 40 MG PO TABS: 40.0000 mg | ORAL_TABLET | Freq: Every day | ORAL | 2 refills | Status: DC

## 2021-09-10 NOTE — Progress Notes (Signed)
Primary Physician/Referring:  [name], MD  Patient ID: Andres Ortiz, male    DOB: 1981-01-05, 41 y.o.   MRN: [id number]  Chief Complaint  Patient presents with   Hypertension   New Patient (Initial Visit)    Referred by [name], MD   HPI:    LUE DUBUQUE  is a 41 y.o. male with past medical history significant for hypertension, hyperlipidemia, and fatty liver who is here to establish care with cardiology.  Patient has had uncontrolled blood pressure for very long time.  He did try a beta-blocker in the past but he had significant side effects and does not want to try that again.  Patient is agreeable to adding another blood pressure pill and starting a cholesterol medication.  Patient has never had an echo in the past.  He has not had secondary work-up for his uncontrolled hypertension.  Today he denies chest pain, shortness of breath, palpitations, diaphoresis, syncope.  Past Medical History:  Diagnosis Date   BMI 34.0-34.9,adult    Hypertension    Irregular heart rate    "They told me to always mention it but I don't know what it was."   Low back pain    Past Surgical History:  Procedure Laterality Date   NO PAST SURGERIES     Family History  Problem Relation Age of Onset   Hypertension Mother    Arthritis Mother    Arthritis Maternal Uncle    Muscular dystrophy Paternal Aunt    Arthritis Maternal Grandmother    Diabetes Maternal Grandmother    Muscular dystrophy Paternal Uncle     Social History   Tobacco Use   Smoking status: Former    Types: Cigarettes    Quit date: 01/27/2006    Years since quitting: 15.6   Smokeless tobacco: Never  Substance Use Topics   Alcohol use: Not Currently    Alcohol/week: 5.0 standard drinks of alcohol    Types: 5 Shots of liquor per week   Marital Status: Married  ROS  Review of Systems  Constitutional: Negative.  Cardiovascular: Negative.   Respiratory:  Positive for cough.   Neurological:  Positive for  light-headedness.  All other systems reviewed and are negative. Objective  Blood pressure (!) 172/104, pulse 85, temperature 97.8 F (36.6 C), temperature source Temporal, resp. rate 16, height 5' 8.5" (1.74 m), weight 227 lb 12.8 oz (103.3 kg), SpO2 96 %. Body mass index is 34.13 kg/m.     09/10/2021    9:37 AM 09/10/2021    9:18 AM 02/26/2021    9:26 AM  Vitals with BMI  Height  5' 8.5" 5' 8.5"  Weight  227 lbs 13 oz 227 lbs 13 oz  BMI  34.13 34.13  Systolic 172 166 573  Diastolic 104 102 92  Pulse 85 76 72     Physical Exam Vitals reviewed.  Constitutional:      Appearance: Normal appearance.  HENT:     Head: Normocephalic and atraumatic.  Eyes:     Extraocular Movements: Extraocular movements intact.  Neck:     Vascular: No carotid bruit.  Cardiovascular:     Rate and Rhythm: Normal rate and regular rhythm.     Pulses: Normal pulses.     Heart sounds: Normal heart sounds. No murmur heard.    No friction rub. No gallop.  Pulmonary:     Effort: Pulmonary effort is normal.     Breath sounds: Normal breath sounds.  Abdominal:  General: Bowel sounds are normal.     Palpations: Abdomen is soft.  Musculoskeletal:     Right lower leg: No edema.     Left lower leg: No edema.  Skin:    General: Skin is warm and dry.  Neurological:     Mental Status: He is alert.   Medications and allergies  No Known Allergies   Medication list after today's encounter   Current Outpatient Medications:    amLODipine (NORVASC) 10 MG tablet, TAKE 1 TABLET BY MOUTH EVERY DAY, Disp: 90 tablet, Rfl: 0   chlorthalidone (HYGROTON) 25 MG tablet, Take 1 tablet (25 mg total) by mouth daily., Disp: 30 tablet, Rfl: 2   lisinopril (ZESTRIL) 40 MG tablet, TAKE 1 TABLET EVERY DAY, Disp: 90 tablet, Rfl: 3   rosuvastatin (CRESTOR) 40 MG tablet, Take 1 tablet (40 mg total) by mouth at bedtime., Disp: 30 tablet, Rfl: 2  Laboratory examination:   Lab Results  Component Value Date   NA 141  04/06/2018   K 4.2 04/06/2018   CO2 20 04/06/2018   GLUCOSE 122 (H) 04/06/2018   BUN 7 04/06/2018   CREATININE 1.04 04/06/2018   CALCIUM 9.4 04/06/2018   GFRNONAA 90 04/06/2018       Latest Ref Rng & Units 04/06/2018    9:54 AM 02/19/2016   11:36 AM 10/25/2013    8:37 AM  CMP  Glucose 65 - 99 mg/dL 725  98  366   BUN 6 - 20 mg/dL 7  12  8    Creatinine 0.76 - 1.27 mg/dL  4.40  3.47   Sodium 134 - 144 mmol/L 141  140  139   Potassium 3.5 - 5.2 mmol/L 4.2  4.6  3.6   Chloride 96 - 106 mmol/L 105  104  105   CO2 20 - 29 mmol/L 20  26  20    Calcium 8.7 - 10.2 mg/dL 9.4  9.9  9.4   Total Protein 6.0 - 8.5 g/dL 6.8  7.2    Total Bilirubin 0.0 - 1.2 mg/dL 0.4  0.8    Alkaline Phos 39 - 117 IU/L 102  72    AST 0 - 40 IU/L 26  26    ALT 0 - 44 IU/L 46  34        Latest Ref Rng & Units 09/30/2008    8:46 AM 09/09/2007   10:50 AM 08/15/2007    8:45 PM  CBC  WBC 4.0 - 10.5 K/uL 9.3  6.8  12.4   Hemoglobin 13.0 - 17.0 g/dL 09/11/2007  08/17/2007  95.6   Hematocrit 39.0 - 52.0 % 44.1  44.1  46.0   Platelets 150 - 400 K/uL 376  372  386     Lipid Panel No results for input(s): "CHOL", "TRIG", "LDLCALC", "VLDL", "HDL", "CHOLHDL", "LDLDIRECT" in the last 8760 hours.  HEMOGLOBIN A1C No results found for: "HGBA1C", "MPG" TSH No results for input(s): "TSH" in the last 8760 hours.  External labs:     Radiology:    Cardiac Studies:   No results found for this or any previous visit from the past 1095 days.     No results found for this or any previous visit from the past 1095 days.     EKG:   09/10/21 EKG: NSR with early repolarization changes  Assessment     ICD-10-CM   1. Essential hypertension  I10 EKG 12-Lead    PCV ECHOCARDIOGRAM COMPLETE    COMPLETE METABOLIC PANEL  WITH GFR    Aldosterone + renin activity w/ ratio    High sensitivity CRP    Hgb A1c w/o eAG    PCV RENAL/RENAL ARTERY DUPLEX COMPLETE    Lipid Panel With LDL/HDL Ratio    2. Hyperlipidemia LDL goal <70   E78.5     3. Fatty liver  K76.0        Orders Placed This Encounter  Procedures   COMPLETE METABOLIC PANEL WITH GFR   Aldosterone + renin activity w/ ratio   High sensitivity CRP   Hgb A1c w/o eAG   Lipid Panel With LDL/HDL Ratio   EKG 12-Lead   PCV ECHOCARDIOGRAM COMPLETE    Standing Status:   Future    Standing Expiration Date:   09/11/2022    Meds ordered this encounter  Medications   chlorthalidone (HYGROTON) 25 MG tablet    Sig: Take 1 tablet (25 mg total) by mouth daily.    Dispense:  30 tablet    Refill:  2   rosuvastatin (CRESTOR) 40 MG tablet    Sig: Take 1 tablet (40 mg total) by mouth at bedtime.    Dispense:  30 tablet    Refill:  2    Medications Discontinued During This Encounter  Medication Reason   METOPROLOL TARTRATE PO      Recommendations:   UNDREA SHIPES is a 41 y.o.  M with uncontrolled hypertension  Chlorthalidone added to HTN regiment Take chlorthalidone and amlodipine in the morning Take Lisinopril and Crestor at night Schedule echo and renal ultrasound Labs ordered F/U in 6 weeks    Clotilde Dieter, DO  09/10/2021, 10:04 AM Office: [phone] Pager: [phone]

## 2021-09-10 NOTE — Patient Instructions (Signed)
Take chlorthalidone and amlodipine in the morning Take crestor and lisinopril at night.

## 2021-10-22 ENCOUNTER — Ambulatory Visit: Payer: 59

## 2021-10-22 DIAGNOSIS — I10 Essential (primary) hypertension: Secondary | ICD-10-CM

## 2021-10-23 LAB — APO A1 + B + RATIO
Apolipo. B/A-1 Ratio: 1.3 ratio — ABNORMAL HIGH (ref 0.0–0.7)
Apolipoprotein A-1: 122 mg/dL (ref 101–178)
Apolipoprotein B: 158 mg/dL — ABNORMAL HIGH (ref <90)

## 2021-10-26 LAB — CMP14+EGFR
ALT: 48 IU/L — ABNORMAL HIGH (ref 0–44)
AST: 25 IU/L (ref 0–40)
Albumin/Globulin Ratio: 1.8 (ref 1.2–2.2)
Albumin: 4.6 g/dL (ref 4.1–5.1)
Alkaline Phosphatase: 90 IU/L (ref 44–121)
BUN/Creatinine Ratio: 11 (ref 9–20)
BUN: 11 mg/dL (ref 6–24)
Bilirubin Total: 0.6 mg/dL (ref 0.0–1.2)
CO2: 22 mmol/L (ref 20–29)
Calcium: 9.7 mg/dL (ref 8.7–10.2)
Chloride: 100 mmol/L (ref 96–106)
Creatinine, Ser: 1.04 mg/dL (ref 0.76–1.27)
Globulin, Total: 2.5 g/dL (ref 1.5–4.5)
Glucose: 96 mg/dL (ref 70–99)
Potassium: 4.1 mmol/L (ref 3.5–5.2)
Sodium: 139 mmol/L (ref 134–144)
Total Protein: 7.1 g/dL (ref 6.0–8.5)
eGFR: 91 mL/min/{1.73_m2} (ref >59)

## 2021-10-26 LAB — ALDOSTERONE + RENIN ACTIVITY W/ RATIO
Aldos/Renin Ratio: 3.4 (ref 0.0–30.0)
Aldosterone: 6 ng/dL (ref 0.0–30.0)
Renin Activity, Plasma: 1.757 ng/mL/hr (ref 0.167–5.380)

## 2021-10-26 LAB — LIPID PANEL WITH LDL/HDL RATIO
Cholesterol, Total: 270 mg/dL — ABNORMAL HIGH (ref 100–199)
HDL: 42 mg/dL (ref >39)
LDL Chol Calc (NIH): 201 mg/dL — ABNORMAL HIGH (ref 0–99)
LDL/HDL Ratio: 4.8 ratio — ABNORMAL HIGH (ref 0.0–3.6)
Triglycerides: 145 mg/dL (ref 0–149)
VLDL Cholesterol Cal: 27 mg/dL (ref 5–40)

## 2021-10-26 LAB — HIGH SENSITIVITY CRP: CRP, High Sensitivity: 1.75 mg/L (ref 0.00–3.00)

## 2021-10-26 LAB — HGB A1C W/O EAG: Hgb A1c MFr Bld: 6.4 % — ABNORMAL HIGH (ref 4.8–5.6)

## 2021-10-31 ENCOUNTER — Ambulatory Visit: Payer: 59 | Admitting: Internal Medicine

## 2021-11-08 ENCOUNTER — Ambulatory Visit: Payer: 59 | Admitting: Internal Medicine

## 2021-11-08 ENCOUNTER — Encounter: Payer: Self-pay | Admitting: Internal Medicine

## 2021-11-08 VITALS — BP 148/102 | HR 83 | Temp 98.2°F | Resp 16 | Ht 68.0 in | Wt 227.0 lb

## 2021-11-08 DIAGNOSIS — I10 Essential (primary) hypertension: Secondary | ICD-10-CM

## 2021-11-08 DIAGNOSIS — E785 Hyperlipidemia, unspecified: Secondary | ICD-10-CM | POA: Insufficient documentation

## 2021-11-08 DIAGNOSIS — E119 Type 2 diabetes mellitus without complications: Secondary | ICD-10-CM | POA: Insufficient documentation

## 2021-11-08 MED ORDER — AMLODIPINE BESYLATE 10 MG PO TABS: 10.0000 mg | ORAL_TABLET | Freq: Every day | ORAL | 6 refills | Status: DC

## 2021-11-08 MED ORDER — ATORVASTATIN CALCIUM 40 MG PO TABS: 40.0000 mg | ORAL_TABLET | Freq: Every day | ORAL | 3 refills | Status: DC

## 2021-11-08 MED ORDER — ROSUVASTATIN CALCIUM 40 MG PO TABS: 40.0000 mg | ORAL_TABLET | Freq: Every day | ORAL | 3 refills | Status: DC

## 2021-11-08 NOTE — Progress Notes (Signed)
Primary Physician/Referring:  Donald Prose, MD  Patient ID: Andres Ortiz, male    DOB: 1980-12-28, 41 y.o.   MRN: [id number]  Chief Complaint  Patient presents with   Hypertension   Follow-up   HPI:    Andres Ortiz  is a 41 y.o. male with past medical history significant for hypertension, hyperlipidemia, and fatty liver who is here for follow-up visit. He did not tolerate Crestor at all, he had very severe side effects and stopped taking it. Patient felt very SOB and like he was dying. He is willing to try another statin. Patient is very concerned about his health. We discussed diet, exercise, and lifestyle choices to manage his hypertension and hyperlipidemia. He denies chest pain, shortness of breath, palpitations, diaphoresis, syncope, edema, orthopnea, PND.  Past Medical History:  Diagnosis Date   BMI 34.0-34.9,adult    Hypertension    Irregular heart rate    "They told me to always mention it but I don't know what it was."   Low back pain    Past Surgical History:  Procedure Laterality Date   NO PAST SURGERIES     Family History  Problem Relation Age of Onset   Hypertension Mother    Arthritis Mother    Arthritis Maternal Uncle    Muscular dystrophy Paternal Aunt    Arthritis Maternal Grandmother    Diabetes Maternal Grandmother    Muscular dystrophy Paternal Uncle     Social History   Tobacco Use   Smoking status: Former    Packs/day: 0.25    Years: 3.00    Total pack years: 0.75    Types: Cigarettes    Quit date: 01/27/2006    Years since quitting: 15.7   Smokeless tobacco: Never  Substance Use Topics   Alcohol use: Not Currently    Alcohol/week: 5.0 standard drinks of alcohol    Types: 5 Shots of liquor per week   Marital Status: Married  ROS  Review of Systems  Constitutional: Negative.  Cardiovascular: Negative.   All other systems reviewed and are negative.  Objective  Blood pressure (!) 148/102, pulse 83, temperature 98.2 F (36.8  C), temperature source Temporal, resp. rate 16, height $RemoveBe'5\' 8"'DsWOOXZBP$  (1.727 m), weight 227 lb (103 kg), SpO2 97 %. Body mass index is 34.52 kg/m.     11/08/2021    2:07 PM 09/10/2021    9:37 AM 09/10/2021    9:18 AM  Vitals with BMI  Height $Remov'5\' 8"'KOIcel$   5' 8.5"  Weight 227 lbs  227 lbs 13 oz  BMI 03.55  97.41  Systolic 638 453 646  Diastolic 803 212 248  Pulse 83 85 76     Physical Exam Vitals and nursing note reviewed.  HENT:     Head: Normocephalic and atraumatic.  Neck:     Vascular: No carotid bruit.  Cardiovascular:     Rate and Rhythm: Normal rate and regular rhythm.     Pulses: Normal pulses.     Heart sounds: Normal heart sounds. No murmur heard.    No friction rub. No gallop.  Pulmonary:     Effort: Pulmonary effort is normal.     Breath sounds: Normal breath sounds.  Abdominal:     General: Bowel sounds are normal.  Musculoskeletal:     Right lower leg: No edema.     Left lower leg: No edema.  Skin:    General: Skin is warm and dry.  Neurological:     Mental  Status: He is alert.    Medications and allergies  No Known Allergies   Medication list after today's encounter   Current Outpatient Medications:    amLODipine (NORVASC) 10 MG tablet, Take 1 tablet (10 mg total) by mouth daily., Disp: 30 tablet, Rfl: 6   atorvastatin (LIPITOR) 40 MG tablet, Take 1 tablet (40 mg total) by mouth daily., Disp: 90 tablet, Rfl: 3   chlorthalidone (HYGROTON) 25 MG tablet, Take 1 tablet (25 mg total) by mouth daily., Disp: 30 tablet, Rfl: 2   lisinopril (ZESTRIL) 40 MG tablet, TAKE 1 TABLET EVERY DAY, Disp: 90 tablet, Rfl: 3   valACYclovir (VALTREX) 500 MG tablet, Take 500 mg by mouth 2 (two) times daily as needed., Disp: , Rfl:   Laboratory examination:   Lab Results  Component Value Date   NA 139 10/22/2021   K 4.1 10/22/2021   CO2 22 10/22/2021   GLUCOSE 96 10/22/2021   BUN 11 10/22/2021   CREATININE 1.04 10/22/2021   CALCIUM 9.7 10/22/2021   EGFR 91 10/22/2021   GFRNONAA 90  04/06/2018       Latest Ref Rng & Units 10/22/2021   10:13 AM 04/06/2018    9:54 AM 02/19/2016   11:36 AM  CMP  Glucose 70 - 99 mg/dL 96  122  98   BUN 6 - 24 mg/dL $Remove'11  7  12   'SodrSyt$ Creatinine 0.76 - 1.27 mg/dL 1.04  1.04  1.13   Sodium 134 - 144 mmol/L 139  141  140   Potassium 3.5 - 5.2 mmol/L 4.1  4.2  4.6   Chloride 96 - 106 mmol/L 100  105  104   CO2 20 - 29 mmol/L $RemoveB'22  20  26   'nXupBtuB$ Calcium 8.7 - 10.2 mg/dL 9.7  9.4  9.9   Total Protein 6.0 - 8.5 g/dL 7.1  6.8  7.2   Total Bilirubin 0.0 - 1.2 mg/dL 0.6  0.4  0.8   Alkaline Phos 44 - 121 IU/L 90  102  72   AST 0 - 40 IU/L $Remov'25  26  26   'EiSUml$ ALT 0 - 44 IU/L 48  46  34       Latest Ref Rng & Units 09/30/2008    8:46 AM 09/09/2007   10:50 AM 08/15/2007    8:45 PM  CBC  WBC 4.0 - 10.5 K/uL 9.3  6.8  12.4   Hemoglobin 13.0 - 17.0 g/dL 14.8  14.9  15.4   Hematocrit 39.0 - 52.0 % 44.1  44.1  46.0   Platelets 150 - 400 K/uL 376  372  386     Lipid Panel Recent Labs    10/22/21 1013  CHOL 270*  TRIG 145  LDLCALC 201*  HDL 42    HEMOGLOBIN A1C Lab Results  Component Value Date   HGBA1C 6.4 (H) 10/22/2021   TSH No results for input(s): "TSH" in the last 8760 hours.  External labs:     Radiology:    Cardiac Studies:   Renal artery duplex  10/22/2021:  No evidence of renal artery occlusive disease in either renal artery.  Normal intrarenal vascular perfusion is noted in both kidneys.  Renal length is within normal limits for both kidneys.  Normal abdominal aorta flow velocities noted.    Echocardiogram 10/22/2021  1. Normal LV systolic function with visual EF 55-60%. Left ventricle cavity is normal in size. Moderate concentric hypertrophy of the left ventricle. Normal global wall motion.  Normal diastolic  filling pattern, normal LAP. Calculated EF 55%.  2. Structurally normal tricuspid valve with no regurgitation. No evidence of pulmonary hypertension.  3. No prior available for comparison   EKG:   09/10/21 EKG: NSR with  early repolarization changes  Assessment     ICD-10-CM   1. Hyperlipidemia, unspecified hyperlipidemia type  E78.5     2. Essential hypertension  I10     3. Type 2 diabetes mellitus without complication, without long-term current use of insulin (HCC)  E11.9        No orders of the defined types were placed in this encounter.   Meds ordered this encounter  Medications   amLODipine (NORVASC) 10 MG tablet    Sig: Take 1 tablet (10 mg total) by mouth daily.    Dispense:  30 tablet    Refill:  6   DISCONTD: rosuvastatin (CRESTOR) 40 MG tablet    Sig: Take 1 tablet (40 mg total) by mouth daily.    Dispense:  90 tablet    Refill:  3   atorvastatin (LIPITOR) 40 MG tablet    Sig: Take 1 tablet (40 mg total) by mouth daily.    Dispense:  90 tablet    Refill:  3    Medications Discontinued During This Encounter  Medication Reason   rosuvastatin (CRESTOR) 40 MG tablet    amLODipine (NORVASC) 10 MG tablet    rosuvastatin (CRESTOR) 40 MG tablet      Recommendations:   Andres Ortiz is a 41 y.o.  M with uncontrolled hypertension   Hyperlipidemia, unspecified hyperlipidemia type Patient did not tolerate crestor, had severe side effects Will try lipitor, if fails statin will need repatha given elevated LDL and LP-B Repeat in 3 months  Essential hypertension Renal ultrasound within normal limits Echo shows moderate hypertrophy from HTN Continue current meds Amlodipine resent as pt ran out   Type 2 diabetes mellitus without complication, without long-term current use of insulin ([place]) Encourage diet and exercise as pt is right on the border for diabetes right now Repeat hgbA1c in 3 months    Follow up in 3 months or sooner if needed   Floydene Flock, DO  11/08/2021, 2:47 PM Office: [phone] Pager: [phone]

## 2021-12-08 ENCOUNTER — Other Ambulatory Visit: Payer: Self-pay | Admitting: Internal Medicine

## 2022-02-12 ENCOUNTER — Ambulatory Visit: Payer: 59 | Admitting: Internal Medicine

## 2022-02-12 ENCOUNTER — Encounter: Payer: Self-pay | Admitting: Internal Medicine

## 2022-02-12 VITALS — BP 146/94 | HR 89 | Resp 16 | Ht 68.0 in | Wt 228.4 lb

## 2022-02-12 DIAGNOSIS — E119 Type 2 diabetes mellitus without complications: Secondary | ICD-10-CM

## 2022-02-12 DIAGNOSIS — I10 Essential (primary) hypertension: Secondary | ICD-10-CM

## 2022-02-12 DIAGNOSIS — E785 Hyperlipidemia, unspecified: Secondary | ICD-10-CM

## 2022-02-12 MED ORDER — LEQVIO 284 MG/1.5ML ~~LOC~~ SOSY: 284.0000 mg | PREFILLED_SYRINGE | Freq: Once | SUBCUTANEOUS | 0 refills | Status: AC

## 2022-02-12 MED ORDER — TADALAFIL 10 MG PO TABS: 10.0000 mg | ORAL_TABLET | Freq: Every day | ORAL | 1 refills | Status: DC | PRN

## 2022-02-12 NOTE — Progress Notes (Signed)
Primary Physician/Referring:  [name], MD  Patient ID: Andres Ortiz, male    DOB: 1980-12-13, 42 y.o.   MRN: [id number]  Chief Complaint  Patient presents with   Hyperlipidemia   Follow-up   HPI:    Andres Ortiz  is a 42 y.o. male with past medical history significant for hypertension, hyperlipidemia, and fatty liver who is here for follow-up visit. He did not tolerate Crestor at all, he had very severe side effects and stopped taking it so we switched him to Lipitor. Patient states he really tried to take the cholesterol pills but he has such severe side effects he cannot tolerate it. Patient gets severe muscle pains and ED every time he takes a statin. He has been trying to take it intermittently to see if that is tolerable but it is still giving him side effects after even 1-2 doses. Patient has significantly changed his eating habits, now he eats mostly veggies and fish or [place]. He has lost weight and is back in the gym regularly. He is really trying to control his HTN and HLD. Patient has a BP cuff at home and states at home it is always <130/80 but at the doctors office it is a little elevated. He is going on a cruise this weekend with his wife and would like something to help with statin-induced ED.  He denies chest pain, shortness of breath, palpitations, diaphoresis, syncope, edema, orthopnea, PND.  Past Medical History:  Diagnosis Date   BMI 34.0-34.9,adult    Hypertension    Irregular heart rate    "They told me to always mention it but I don't know what it was."   Low back pain    Past Surgical History:  Procedure Laterality Date   NO PAST SURGERIES     Family History  Problem Relation Age of Onset   Hypertension Mother    Arthritis Mother    Arthritis Maternal Uncle    Muscular dystrophy Paternal Aunt    Arthritis Maternal Grandmother    Diabetes Maternal Grandmother    Muscular dystrophy Paternal Uncle     Social History   Tobacco Use    Smoking status: Former    Packs/day: 0.25    Years: 3.00    Total pack years: 0.75    Types: Cigarettes    Quit date: 01/27/2006    Years since quitting: 16.0   Smokeless tobacco: Never  Substance Use Topics   Alcohol use: Not Currently    Alcohol/week: 5.0 standard drinks of alcohol    Types: 5 Shots of liquor per week   Marital Status: Married  ROS  Review of Systems  Constitutional: Negative.  Cardiovascular: Negative.   Musculoskeletal:  Positive for myalgias.  Genitourinary:  Positive for decreased libido.  All other systems reviewed and are negative.  Objective  Blood pressure (!) 146/94, pulse 89, resp. rate 16, height 5\' 8"  (1.727 m), weight 228 lb 6.4 oz (103.6 kg), SpO2 97 %. Body mass index is 34.73 kg/m.     02/12/2022    8:53 AM 11/08/2021    2:07 PM 09/10/2021    9:37 AM  Vitals with BMI  Height 5\' 8"  5\' 8"    Weight 228 lbs 6 oz 227 lbs   BMI 34.74 34.52   Systolic 146 148 09/12/2021  Diastolic 94 102 104  Pulse 89 83 85     Physical Exam Vitals and nursing note reviewed.  HENT:     Head: Normocephalic and atraumatic.  Neck:     Vascular: No carotid bruit.  Cardiovascular:     Rate and Rhythm: Normal rate and regular rhythm.     Pulses: Normal pulses.     Heart sounds: Normal heart sounds. No murmur heard.    No friction rub. No gallop.  Pulmonary:     Effort: Pulmonary effort is normal.     Breath sounds: Normal breath sounds.  Abdominal:     General: Bowel sounds are normal.  Musculoskeletal:     Right lower leg: No edema.     Left lower leg: No edema.  Skin:    General: Skin is warm and dry.  Neurological:     Mental Status: He is alert.    Medications and allergies   Allergies  Allergen Reactions   Statins Other (See Comments)    ED and severe myalgias     Medication list after today's encounter   Current Outpatient Medications:    amLODipine (NORVASC) 10 MG tablet, Take 1 tablet (10 mg total) by mouth daily., Disp: 30 tablet, Rfl: 6    chlorthalidone (HYGROTON) 25 MG tablet, TAKE 1 TABLET (25 MG TOTAL) BY MOUTH DAILY., Disp: 90 tablet, Rfl: 0   inclisiran (LEQVIO) 284 MG/1.5ML SOSY injection, Inject 1.5 mLs (284 mg total) into the skin once for 1 dose., Disp: 1.5 mL, Rfl: 0   lisinopril (ZESTRIL) 40 MG tablet, TAKE 1 TABLET EVERY DAY, Disp: 90 tablet, Rfl: 3   tadalafil (CIALIS) 10 MG tablet, Take 1 tablet (10 mg total) by mouth daily as needed for erectile dysfunction., Disp: 10 tablet, Rfl: 1   valACYclovir (VALTREX) 500 MG tablet, Take 500 mg by mouth 2 (two) times daily as needed., Disp: , Rfl:   Laboratory examination:   Lab Results  Component Value Date   NA 139 10/22/2021   K 4.1 10/22/2021   CO2 22 10/22/2021   GLUCOSE 96 10/22/2021   BUN 11 10/22/2021   CREATININE 1.04 10/22/2021   CALCIUM 9.7 10/22/2021   EGFR 91 10/22/2021   GFRNONAA 90 04/06/2018       Latest Ref Rng & Units 10/22/2021   10:13 AM 04/06/2018    9:54 AM 02/19/2016   11:36 AM  CMP  Glucose 70 - 99 mg/dL 96  122  98   BUN 6 - 24 mg/dL 11  7  12    Creatinine 0.76 - 1.27 mg/dL 1.04  1.04  1.13   Sodium 134 - 144 mmol/L 139  141  140   Potassium 3.5 - 5.2 mmol/L 4.1  4.2  4.6   Chloride 96 - 106 mmol/L 100  105  104   CO2 20 - 29 mmol/L 22  20  26    Calcium 8.7 - 10.2 mg/dL 9.7  9.4  9.9   Total Protein 6.0 - 8.5 g/dL 7.1  6.8  7.2   Total Bilirubin 0.0 - 1.2 mg/dL 0.6  0.4  0.8   Alkaline Phos 44 - 121 IU/L 90  102  72   AST 0 - 40 IU/L 25  26  26    ALT 0 - 44 IU/L 48  46  34       Latest Ref Rng & Units 09/30/2008    8:46 AM 09/09/2007   10:50 AM 08/15/2007    8:45 PM  CBC  WBC 4.0 - 10.5 K/uL 9.3  6.8  12.4   Hemoglobin 13.0 - 17.0 g/dL 14.8  14.9  15.4   Hematocrit 39.0 - 52.0 %  44.1  44.1  46.0   Platelets 150 - 400 K/uL 376  372  386     Lipid Panel Recent Labs    10/22/21 1013  CHOL 270*  TRIG 145  LDLCALC 201*  HDL 42    HEMOGLOBIN A1C Lab Results  Component Value Date   HGBA1C 6.4 (H) 10/22/2021   TSH No  results for input(s): "TSH" in the last 8760 hours.  External labs:     Radiology:    Cardiac Studies:   Renal artery duplex  10/22/2021:  No evidence of renal artery occlusive disease in either renal artery.  Normal intrarenal vascular perfusion is noted in both kidneys.  Renal length is within normal limits for both kidneys.  Normal abdominal aorta flow velocities noted.    Echocardiogram 10/22/2021  1. Normal LV systolic function with visual EF 55-60%. Left ventricle cavity is normal in size. Moderate concentric hypertrophy of the left ventricle. Normal global wall motion.  Normal diastolic filling pattern, normal LAP. Calculated EF 55%.  2. Structurally normal tricuspid valve with no regurgitation. No evidence of pulmonary hypertension.  3. No prior available for comparison   EKG:   09/10/21 EKG: NSR with early repolarization changes  Assessment     ICD-10-CM   1. Pre-diabetes  E11.9 Hgb A1c w/o eAG    Lipid Panel With LDL/HDL Ratio    Basic metabolic panel    2. Essential hypertension  I10     3. Hyperlipidemia LDL goal <70  E78.5 inclisiran (LEQVIO) 284 MG/1.5ML SOSY injection       Orders Placed This Encounter  Procedures   Hgb A1c w/o eAG   Lipid Panel With LDL/HDL Ratio   Basic metabolic panel    Meds ordered this encounter  Medications   tadalafil (CIALIS) 10 MG tablet    Sig: Take 1 tablet (10 mg total) by mouth daily as needed for erectile dysfunction.    Dispense:  10 tablet    Refill:  1   inclisiran (LEQVIO) 284 MG/1.5ML SOSY injection    Sig: Inject 1.5 mLs (284 mg total) into the skin once for 1 dose.    Dispense:  1.5 mL    Refill:  0    Medications Discontinued During This Encounter  Medication Reason   atorvastatin (LIPITOR) 40 MG tablet      Recommendations:   ALYAS CREARY is a 42 y.o.  M with uncontrolled hypertension   Hyperlipidemia Patient did not tolerate crestor or lipitor He has severe myalgias and ED from  statins, even from 1 dose He has been eating much healthier and exercising regularly to try and control his lipids better Patient requesting cialis as he is going on a cruise with his wife and he is having ED from statins Will add statins to his allergy list as he cannot tolerate even intermittently Leqvio approval process started, patient agreeable   Essential hypertension Continue current cardiac medications. Home BP always <130/80 and he checks it daily Encourage low-sodium diet, less than 2000 mg daily.   Pre-diabetes Encourage diet and exercise as pt was right on the border for diabetes last visit Repeat hgbA1c, BMP, and lipids today  Patient has changed his diet drastically and lost weight with exercise   Follow up in 3 months or sooner if needed    Andres Flock, Andres Ortiz  02/12/2022, 9:43 AM Office: [phone] Pager: [phone]

## 2022-02-13 LAB — BASIC METABOLIC PANEL
BUN/Creatinine Ratio: 14 (ref 9–20)
BUN: 18 mg/dL (ref 6–24)
CO2: 23 mmol/L (ref 20–29)
Calcium: 10.2 mg/dL (ref 8.7–10.2)
Chloride: 99 mmol/L (ref 96–106)
Creatinine, Ser: 1.32 mg/dL — ABNORMAL HIGH (ref 0.76–1.27)
Glucose: 112 mg/dL — ABNORMAL HIGH (ref 70–99)
Potassium: 4.1 mmol/L (ref 3.5–5.2)
Sodium: 138 mmol/L (ref 134–144)
eGFR: 69 mL/min/{1.73_m2} (ref >59)

## 2022-02-13 LAB — LIPID PANEL WITH LDL/HDL RATIO
Cholesterol, Total: 236 mg/dL — ABNORMAL HIGH (ref 100–199)
HDL: 47 mg/dL (ref >39)
LDL Chol Calc (NIH): 164 mg/dL — ABNORMAL HIGH (ref 0–99)
LDL/HDL Ratio: 3.5 ratio (ref 0.0–3.6)
Triglycerides: 139 mg/dL (ref 0–149)
VLDL Cholesterol Cal: 25 mg/dL (ref 5–40)

## 2022-02-13 LAB — HGB A1C W/O EAG: Hgb A1c MFr Bld: 6.1 % — ABNORMAL HIGH (ref 4.8–5.6)

## 2022-02-13 NOTE — Progress Notes (Signed)
Patient aware

## 2022-02-19 ENCOUNTER — Telehealth: Payer: Self-pay

## 2022-02-19 NOTE — Telephone Encounter (Signed)
faxed patient assistance for Leqvio on 02/19/2022. LM

## 2022-02-25 ENCOUNTER — Other Ambulatory Visit: Payer: Self-pay

## 2022-02-27 ENCOUNTER — Telehealth: Payer: Self-pay | Admitting: Pharmacy Technician

## 2022-02-27 NOTE — Telephone Encounter (Addendum)
Auth Submission: referral has been cancelled. Tx plan has changed Payer: [employer] Medication & CPT/J Code(s) submitted: Leqvio (Inclisiran) [phone] Route of submission (phone, fax, portal):  Phone # [phone] Fax [phone] Auth type: Buy/Bill Units/visits requested: X2 Reference number:  Approval from:   Will need SAMS score to complete auth. Awaiting updated chart notes. Spoke with [employer] @ [place] Phone: [phone].

## 2022-03-03 ENCOUNTER — Other Ambulatory Visit: Payer: Self-pay | Admitting: Pharmacy Technician

## 2022-03-07 ENCOUNTER — Other Ambulatory Visit: Payer: Self-pay | Admitting: Internal Medicine

## 2022-05-09 ENCOUNTER — Other Ambulatory Visit: Payer: Self-pay | Admitting: Internal Medicine

## 2022-05-14 ENCOUNTER — Ambulatory Visit: Payer: 59 | Admitting: Internal Medicine

## 2022-05-26 ENCOUNTER — Ambulatory Visit: Payer: 59 | Admitting: Internal Medicine

## 2022-05-26 ENCOUNTER — Encounter: Payer: Self-pay | Admitting: Internal Medicine

## 2022-05-26 VITALS — BP 158/96 | HR 77 | Resp 16 | Ht 68.0 in | Wt 225.8 lb

## 2022-05-26 DIAGNOSIS — E119 Type 2 diabetes mellitus without complications: Secondary | ICD-10-CM

## 2022-05-26 DIAGNOSIS — E785 Hyperlipidemia, unspecified: Secondary | ICD-10-CM

## 2022-05-26 DIAGNOSIS — I10 Essential (primary) hypertension: Secondary | ICD-10-CM

## 2022-05-26 MED ORDER — TADALAFIL 10 MG PO TABS: 10.0000 mg | ORAL_TABLET | ORAL | 1 refills | Status: AC | PRN

## 2022-05-26 MED ORDER — ISOSORB DINITRATE-HYDRALAZINE 20-37.5 MG PO TABS: 1.0000 | ORAL_TABLET | Freq: Three times a day (TID) | ORAL | 6 refills | Status: DC

## 2022-05-26 NOTE — Progress Notes (Signed)
Primary Physician/Referring:  [name], MD  Patient ID: Andres Ortiz, male    DOB: 1980/09/02, 42 y.o.   MRN: [id number]  Chief Complaint  Patient presents with   Hypertension   Hyperlipidemia   Follow-up    3 months   HPI:    Andres Ortiz  is a 42 y.o. male with past medical history significant for hypertension, hyperlipidemia, and fatty liver who is here for follow-up visit. He has been doing well since the last time he was here. He checks his BP regularly and it is usually 120-125/80s. He has been compliant with his medications. He did have a long weekend of partying and last night he was up until 3AM for a birthday party so his BP is high right now. He checks is daily at home and it is never this high.  He denies chest pain, shortness of breath, palpitations, diaphoresis, syncope, edema, orthopnea, PND.  Past Medical History:  Diagnosis Date   BMI 34.0-34.9,adult    Hypertension    Irregular heart rate    "They told me to always mention it but I don't know what it was."   Low back pain    Past Surgical History:  Procedure Laterality Date   NO PAST SURGERIES     Family History  Problem Relation Age of Onset   Hypertension Mother    Arthritis Mother    Arthritis Maternal Uncle    Muscular dystrophy Paternal Aunt    Arthritis Maternal Grandmother    Diabetes Maternal Grandmother    Muscular dystrophy Paternal Uncle     Social History   Tobacco Use   Smoking status: Former    Packs/day: 0.25    Years: 3.00    Additional pack years: 0.00    Total pack years: 0.75    Types: Cigarettes    Quit date: 01/27/2006    Years since quitting: 16.3   Smokeless tobacco: Never  Substance Use Topics   Alcohol use: Not Currently    Alcohol/week: 5.0 standard drinks of alcohol    Types: 5 Shots of liquor per week   Marital Status: Married  ROS  Review of Systems  Constitutional: Negative.  Cardiovascular: Negative.  Negative for chest pain, dyspnea on  exertion, leg swelling and orthopnea.  Musculoskeletal:  Negative for myalgias.  Genitourinary:  Negative for decreased libido.  All other systems reviewed and are negative.  Objective  Blood pressure (!) 158/96, pulse 77, resp. rate 16, height 5\' 8"  (1.727 m), weight 225 lb 12.8 oz (102.4 kg), SpO2 98 %. Body mass index is 34.33 kg/m.     05/26/2022    8:37 AM 05/26/2022    8:33 AM 02/12/2022    8:53 AM  Vitals with BMI  Height  5\' 8"  5\' 8"   Weight  225 lbs 13 oz 228 lbs 6 oz  BMI  34.34 34.74  Systolic 158 168 409  Diastolic 96 105 94  Pulse 77 77 89     Physical Exam Vitals and nursing note reviewed.  HENT:     Head: Normocephalic and atraumatic.  Neck:     Vascular: No carotid bruit.  Cardiovascular:     Rate and Rhythm: Normal rate and regular rhythm.     Pulses: Normal pulses.     Heart sounds: Normal heart sounds. No murmur heard.    No friction rub. No gallop.  Pulmonary:     Effort: Pulmonary effort is normal.     Breath sounds: Normal  breath sounds.  Abdominal:     General: Bowel sounds are normal.  Musculoskeletal:     Right lower leg: No edema.     Left lower leg: No edema.  Skin:    General: Skin is warm and dry.  Neurological:     Mental Status: He is alert.    Medications and allergies   Allergies  Allergen Reactions   Statins Other (See Comments)    ED and severe myalgias     Medication list after today's encounter   Current Outpatient Medications:    amLODipine (NORVASC) 10 MG tablet, TAKE 1 TABLET BY MOUTH EVERY DAY, Disp: 90 tablet, Rfl: 2   chlorthalidone (HYGROTON) 25 MG tablet, TAKE 1 TABLET (25 MG TOTAL) BY MOUTH DAILY., Disp: 90 tablet, Rfl: 0   lisinopril (ZESTRIL) 40 MG tablet, TAKE 1 TABLET EVERY DAY, Disp: 90 tablet, Rfl: 3   valACYclovir (VALTREX) 500 MG tablet, Take 500 mg by mouth 2 (two) times daily as needed., Disp: , Rfl:    tadalafil (CIALIS) 10 MG tablet, Take 1 tablet (10 mg total) by mouth as needed for erectile  dysfunction., Disp: 20 tablet, Rfl: 1  Laboratory examination:   Lab Results  Component Value Date   NA 138 02/12/2022   K 4.1 02/12/2022   CO2 23 02/12/2022   GLUCOSE 112 (H) 02/12/2022   BUN 18 02/12/2022   CREATININE 1.32 (H) 02/12/2022   CALCIUM 10.2 02/12/2022   EGFR 69 02/12/2022   GFRNONAA 90 04/06/2018       Latest Ref Rng & Units 02/12/2022    9:51 AM 10/22/2021   10:13 AM 04/06/2018    9:54 AM  CMP  Glucose 70 - 99 mg/dL 161  96  096   BUN 6 - 24 mg/dL 18  11  7    Creatinine 0.76 - 1.27 mg/dL 0.45  4.09  8.11   Sodium 134 - 144 mmol/L 138  139  141   Potassium 3.5 - 5.2 mmol/L 4.1  4.1  4.2   Chloride 96 - 106 mmol/L 99  100  105   CO2 20 - 29 mmol/L 23  22  20    Calcium 8.7 - 10.2 mg/dL 91.4  9.7  9.4   Total Protein 6.0 - 8.5 g/dL  7.1  6.8   Total Bilirubin 0.0 - 1.2 mg/dL  0.6  0.4   Alkaline Phos 44 - 121 IU/L  90  102   AST 0 - 40 IU/L  25  26   ALT 0 - 44 IU/L  48  46       Latest Ref Rng & Units 09/30/2008    8:46 AM 09/09/2007   10:50 AM 08/15/2007    8:45 PM  CBC  WBC 4.0 - 10.5 K/uL 9.3  6.8  12.4   Hemoglobin 13.0 - 17.0 g/dL 78.2  95.6  21.3   Hematocrit 39.0 - 52.0 % 44.1  44.1  46.0   Platelets 150 - 400 K/uL 376  372  386     Lipid Panel Recent Labs    10/22/21 1013 02/12/22 0942  CHOL 270* 236*  TRIG 145 139  LDLCALC 201* 164*  HDL 42 47    HEMOGLOBIN A1C Lab Results  Component Value Date   HGBA1C 6.1 (H) 02/12/2022   TSH No results for input(s): "TSH" in the last 8760 hours.  External labs:     Radiology:    Cardiac Studies:   Renal artery duplex  10/22/2021:  No evidence of renal artery occlusive disease in either renal artery.  Normal intrarenal vascular perfusion is noted in both kidneys.  Renal length is within normal limits for both kidneys.  Normal abdominal aorta flow velocities noted.    Echocardiogram 10/22/2021  1. Normal LV systolic function with visual EF 55-60%. Left ventricle cavity is normal in  size. Moderate concentric hypertrophy of the left ventricle. Normal global wall motion.  Normal diastolic filling pattern, normal LAP. Calculated EF 55%.  2. Structurally normal tricuspid valve with no regurgitation. No evidence of pulmonary hypertension.  3. No prior available for comparison   EKG:   09/10/21 EKG: NSR with early repolarization changes  Assessment     ICD-10-CM   1. Essential hypertension  I10     2. Hyperlipidemia LDL goal <100  E78.5     3. Pre-diabetes  E11.9         No orders of the defined types were placed in this encounter.   Meds ordered this encounter  Medications   DISCONTD: isosorbide-hydrALAZINE (BIDIL) 20-37.5 MG tablet    Sig: Take 1 tablet by mouth 3 (three) times daily.    Dispense:  90 tablet    Refill:  6   tadalafil (CIALIS) 10 MG tablet    Sig: Take 1 tablet (10 mg total) by mouth as needed for erectile dysfunction.    Dispense:  20 tablet    Refill:  1    Medications Discontinued During This Encounter  Medication Reason   tadalafil (CIALIS) 10 MG tablet    isosorbide-hydrALAZINE (BIDIL) 20-37.5 MG tablet      Recommendations:   Andres Ortiz is a 42 y.o.  M with hypertension   Hyperlipidemia, LDL goal <100 Patient did not tolerate crestor or lipitor He has severe myalgias and ED from statins, even from 1 dose He has been eating much healthier and exercising regularly to try and control his lipids better He does not want to try injectables of infusion due to potential side effect of ED He has gotten his LDL down to 91 from 201 with diet and exercise alone   Essential hypertension Continue current cardiac medications. Home BP always <130/80 and he checks it daily - usually he says its 1210-125/80s and he keeps up with his BP regularly to make sure it is not staying high. He is nervous when he comes here and he was partying last night so it is high right now. Encourage low-sodium diet, less than 2000 mg  daily.   Pre-diabetes Encourage diet and exercise as pt was right on the border for diabetes last visit Repeat hgbA1c is improved at 6.1 and he continues to exercise and watch his diet Patient is going to start jogging again also now that it is warmer out   Follow up in 6 months or sooner if needed    Clotilde Dieter, DO  05/26/2022, 9:09 AM Office: [phone] Pager: [phone]

## 2022-06-01 ENCOUNTER — Other Ambulatory Visit: Payer: Self-pay | Admitting: Internal Medicine

## 2022-06-07 ENCOUNTER — Emergency Department (HOSPITAL_COMMUNITY)
Admission: EM | Admit: 2022-06-07 | Discharge: 2022-06-08 | Disposition: A | Discharge status: Home / Self Care | Payer: 59 | Attending: Emergency Medicine | Admitting: Emergency Medicine

## 2022-06-07 DIAGNOSIS — M25562 Pain in left knee: Secondary | ICD-10-CM | POA: Diagnosis present

## 2022-06-07 DIAGNOSIS — Z79899 Other long term (current) drug therapy: Secondary | ICD-10-CM | POA: Diagnosis not present

## 2022-06-07 DIAGNOSIS — W19XXXA Unspecified fall, initial encounter: Secondary | ICD-10-CM

## 2022-06-07 DIAGNOSIS — M25551 Pain in right hip: Secondary | ICD-10-CM | POA: Diagnosis not present

## 2022-06-07 DIAGNOSIS — Z23 Encounter for immunization: Secondary | ICD-10-CM | POA: Diagnosis not present

## 2022-06-07 DIAGNOSIS — I10 Essential (primary) hypertension: Secondary | ICD-10-CM | POA: Insufficient documentation
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
## 2022-06-08 ENCOUNTER — Emergency Department (HOSPITAL_COMMUNITY): Payer: 59

## 2022-06-08 ENCOUNTER — Other Ambulatory Visit: Payer: Self-pay

## 2022-06-08 MED ORDER — OXYCODONE-ACETAMINOPHEN 5-325 MG PO TABS: 1.0000 | ORAL_TABLET | Freq: Three times a day (TID) | ORAL | 0 refills | Status: AC | PRN

## 2022-06-08 MED ORDER — OXYCODONE-ACETAMINOPHEN 5-325 MG PO TABS
1.0000 | ORAL_TABLET | Freq: Once | ORAL | Status: AC
  Administered 2022-06-08: 1 via ORAL
  Filled 2022-06-08: qty 1

## 2022-06-08 MED ORDER — TETANUS-DIPHTH-ACELL PERTUSSIS 5-2.5-18.5 LF-MCG/0.5 IM SUSY
0.5000 mL | PREFILLED_SYRINGE | Freq: Once | INTRAMUSCULAR | Status: AC
  Administered 2022-06-08: 0.5 mL via INTRAMUSCULAR
  Filled 2022-06-08: qty 0.5
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
## 2022-06-08 NOTE — Discharge Instructions (Addendum)
Knee injury-placed in a knee knee brace please wear at all times, weight-bear as tolerated, and not use please keep elevated recommend ibuprofen or Tylenol every 6 hours for pain management.  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  I have given you a short course of narcotics please take as prescribed.  This medication can make you drowsy do not consume alcohol or operate heavy machinery when taking this medication.  This medication is Tylenol in it do not take Tylenol and take this medication.

## 2022-06-29 ENCOUNTER — Other Ambulatory Visit: Payer: Self-pay | Admitting: Cardiovascular Disease

## 2022-06-29 DIAGNOSIS — I10 Essential (primary) hypertension: Secondary | ICD-10-CM

## 2022-06-30 NOTE — Telephone Encounter (Signed)
Refill request

## 2022-08-27 ENCOUNTER — Other Ambulatory Visit: Payer: Self-pay | Admitting: Cardiology

## 2022-11-11 ENCOUNTER — Ambulatory Visit: Payer: 59 | Attending: Cardiology | Admitting: Cardiology

## 2022-11-11 ENCOUNTER — Encounter: Payer: Self-pay | Admitting: Cardiology

## 2022-11-11 VITALS — BP 168/106 | HR 83 | Ht 69.0 in | Wt 216.4 lb

## 2022-11-11 DIAGNOSIS — E782 Mixed hyperlipidemia: Secondary | ICD-10-CM | POA: Diagnosis not present

## 2022-11-11 DIAGNOSIS — I10 Essential (primary) hypertension: Secondary | ICD-10-CM | POA: Diagnosis not present

## 2022-11-11 NOTE — Progress Notes (Signed)
Cardiology Office Note:  .   Date:  11/11/2022  ID:  Andres Ortiz, DOB 02-17-1980, MRN [id number] PCP: [name], MD  [facility] Providers Cardiologist:  Truett Mainland, MD PCP: [name], MD  Chief Complaint  Patient presents with   Hypertension   Hyperlipidemia      History of Present Illness: .    Andres Ortiz is a 42 y.o. male with hypertension, hyperlipidemia, prediabetes  [employer], as well as gym.  He stays active with his physical activity, denies any complaints of chest pain or shortness of breath.  Recently, he has had pain in his left shoulder and left knee, for which she is seeing an orthopedic surgeon.  He does complain of significant pain increased to 8 yesterday.  He also endorses drinking alcohol heavily over the past 3-4 days owing to some family get together/parties.  He has not tolerated statins in the past, but has made significant changes to his diet recently.  Vitals:   11/11/22 1035  BP: (!) 168/106  Pulse: 83  SpO2: 98%     ROS:  Review of Systems  Cardiovascular:  Negative for chest pain, dyspnea on exertion, leg swelling, palpitations and syncope.  Musculoskeletal:  Positive for joint pain.     Studies Reviewed: Marland Kitchen       EKG 11/11/2022: Normal sinus rhythm Normal ECG When compared with ECG of 28-Feb-2015 17:15, No significant change was found    02/12/2022: Glucose 112, BUN/Cr 18/1.32. EGFR 69. Na/K 138/4.1.  Chol 236, TG 139, HDL 47, LDL 164 Echocardiogram 10/22/2021 1. Normal LV systolic function with visual EF 55-60%. Left ventricle cavity is normal in size. Moderate concentric hypertrophy of the left ventricle. Normal global wall motion. Normal diastolic filling pattern, normal LAP. Calculated EF 55%. 2. Structurally normal tricuspid valve with no regurgitation. No evidence of pulmonary hypertension. 3. No prior available for comparison  Renal artery duplex  10/22/2021: No  evidence of renal artery occlusive disease in either renal artery. Normal intrarenal vascular perfusion is noted in both kidneys. Renal length is within normal limits for both kidneys. Normal abdominal aorta flow velocities noted.    Physical Exam:   Physical Exam Vitals and nursing note reviewed.  Constitutional:      General: He is not in acute distress. Neck:     Vascular: No JVD.  Cardiovascular:     Rate and Rhythm: Normal rate and regular rhythm.     Heart sounds: Normal heart sounds. No murmur heard. Pulmonary:     Effort: Pulmonary effort is normal.     Breath sounds: Normal breath sounds. No wheezing or rales.  Musculoskeletal:     Right lower leg: No edema.     Left lower leg: No edema.      VISIT DIAGNOSES:   ICD-10-CM   1. Hypertensive heart disease without heart failure  I11.9 EKG 12-Lead    Lipid panel    CT CARDIAC SCORING (SELF PAY ONLY)    AMB Referral to [facility]       ASSESSMENT AND PLAN: .    Andres Ortiz is a 42 y.o. male with hypertension, hyperlipidemia, prediabetes  Hypertension: Uncontrolled.  Renal artery duplex negative in the past.  Currently, he is on lisinopril 40 mg daily, amlodipine 10 mg daily, chlorthalidone 25 mg daily.  There is potential recent triggers for his hypertension with alcohol intake, as well as significant shoulder and knee pain.  I have not made changes to  his medications today, but encouraged him to check blood pressure regularly at home, which tends to read much lower than this.  I will have him follow-up with our Pharm.D. in 1 month to recheck blood pressure.  If remains elevated, consider adding labetalol 100 mg twice daily.  Mixed hyperlipidemia: LDL as high as 201 in the past, most recently in 160s.  There was a point of care check in February that showed LDL of 91.  I am unsure of accuracy of the same.  However, given that he has made some diet and extra modifications, we can repeat lipid panel along  with calcium score scan today.  He has been intolerant to several different statins in the past.  If calcium score is elevated, and LDL remains greater than 100, could consider adding PCSK9 inhibitors or next visit.  No orders of the defined types were placed in this encounter.    F/u in 3 months  Signed, Elder Negus, MD

## 2022-11-11 NOTE — Patient Instructions (Signed)
Medication Instructions:  Your physician recommends that you continue on your current medications as directed. Please refer to the Current Medication list given to you today.  *If you need a refill on your cardiac medications before your next appointment, please call your pharmacy*  Lab Work: Your physician recommends that you have lab work today. Lipid panel  If you have labs (blood work) drawn today and your tests are completely normal, you will receive your results only by: MyChart Message (if you have MyChart) OR A paper copy in the mail If you have any lab test that is abnormal or we need to change your treatment, we will call you to review the results.   Testing/Procedures:  CT scanning for calcium score, (CAT scanning), is a noninvasive, special x-ray that produces cross-sectional images of the body using x-rays and a computer. CT scans help physicians diagnose and treat medical conditions. For some CT exams, a contrast material is used to enhance visibility in the area of the body being studied. CT scans provide greater clarity and reveal more details than regular x-ray exams.  Follow-Up: At [facility], you and your health needs are our priority.  As part of our continuing mission to provide you with exceptional heart care, we have created designated Provider Care Teams.  These Care Teams include your primary Cardiologist (physician) and Advanced Practice Providers (APPs -  Physician Assistants and Nurse Practitioners) who all work together to provide you with the care you need, when you need it.  We recommend signing up for the patient portal called "MyChart".  Sign up information is provided on this After Visit Summary.  MyChart is used to connect with patients for Virtual Visits (Telemedicine).  Patients are able to view lab/test results, encounter notes, upcoming appointments, etc.  Non-urgent messages can be sent to your provider as well.   To learn more about what you  can do with MyChart, go to [email or web address].    Your next appointment:   3 month(s)  Provider:   Elder Negus, MD     You have been referred to Hypertension Clinic in 1 month  Other Instructions Diet & Lifestyle recommendations:  Physical activity recommendation (The Physical Activity Guidelines for Americans. JAMA 2018;Nov 12) At least 150-300 minutes a week of moderate-intensity, or 75-150 minutes a week of vigorous-intensity aerobic physical activity, or an equivalent combination of moderate- and vigorous-intensity aerobic activity. Adults should perform muscle-strengthening activities on 2 or more days a week. Older adults should do multicomponent physical activity that includes balance training as well as aerobic and muscle-strengthening activities. Benefits of increased physical activity include lower risk of mortality including cardiovascular mortality, lower risk of cardiovascular events and associated risk factors (hypertension and diabetes), and lower risk of many cancers (including bladder, breast, colon, endometrium, esophagus, kidney, lung, and stomach). Additional improvments have been seen in cognition, risk of dementia, anxiety and depression, improved bone health, lower risk of falls, and associated injuries.  Dietary recommendation The 2019 ACC/AHA guidelines promote nutrition as a main fixture of cardiovascular wellness, with a recommendation for a varied diet of fruit, vegetables, fish, legumes, and whole grains (Class I), as well as recommendations to reduce sodium, cholesterol, processed meats, and refined sugars (Class IIa recommendation).10 Sodium intake, a topic of some controversy as of late, is recommended to be kept at 1,500 mg/day or less, far below the average daily intake in the [place] of 3,409 mg/day, and notably below that of previous US recommendations for 300mg /day.10,11 For those  unable to reach 1,500 mg/day, they recommend at least a reduction of  1000 mg/day.  A Pesco-Mediterranean Diet With Intermittent Fasting: JACC Review Topic of the Week. J Am Coll Cardiol 2020;76:1484-1493 Pesco-Mediterranean diet, it is supplemented with extra-virgin olive oil (EVOO), which is the principle fat source, along with moderate amounts of dairy (particularly yogurt and cheese) and eggs, as well as modest amounts of alcohol consumption (ideally red wine with the evening meal), but few red and processed meats.

## 2022-11-12 ENCOUNTER — Telehealth: Payer: Self-pay | Admitting: *Deleted

## 2022-11-12 ENCOUNTER — Other Ambulatory Visit: Payer: Self-pay | Admitting: Internal Medicine

## 2022-11-12 DIAGNOSIS — E782 Mixed hyperlipidemia: Secondary | ICD-10-CM

## 2022-11-12 LAB — LIPID PANEL
Chol/HDL Ratio: 5 {ratio} (ref 0.0–5.0)
Cholesterol, Total: 277 mg/dL — ABNORMAL HIGH (ref 100–199)
HDL: 55 mg/dL (ref >39)
LDL Chol Calc (NIH): 189 mg/dL — ABNORMAL HIGH (ref 0–99)
Triglycerides: 175 mg/dL — ABNORMAL HIGH (ref 0–149)
VLDL Cholesterol Cal: 33 mg/dL (ref 5–40)

## 2022-11-12 NOTE — Progress Notes (Signed)
Cholesterol remains very high. He has not tolerated statins before with myalgias. Recommend pharmD evaluation for PCSK9i or Nexlizet.  Thanks MJP

## 2022-11-12 NOTE — Telephone Encounter (Signed)
-----   Message from [facility] sent at 11/12/2022  7:56 AM EDT ----- Cholesterol remains very high. He has not tolerated statins before with myalgias. Recommend pharmD evaluation for PCSK9i or Nexlizet.  Thanks MJP

## 2022-11-13 ENCOUNTER — Telehealth: Payer: Self-pay

## 2022-11-13 DIAGNOSIS — E785 Hyperlipidemia, unspecified: Secondary | ICD-10-CM

## 2022-11-13 DIAGNOSIS — E782 Mixed hyperlipidemia: Secondary | ICD-10-CM

## 2022-11-13 NOTE — Telephone Encounter (Signed)
Placed referral for pt to Pharm D regarding possible medication add on for a PCSK9 or Nexlizet requested by Dr. Rosemary Holms.

## 2022-11-13 NOTE — Telephone Encounter (Signed)
-----   Message from [facility] sent at 11/12/2022  7:56 AM EDT ----- Cholesterol remains very high. He has not tolerated statins before with myalgias. Recommend pharmD evaluation for PCSK9i or Nexlizet.  Thanks MJP

## 2022-11-17 ENCOUNTER — Ambulatory Visit (HOSPITAL_COMMUNITY)
Admission: RE | Admit: 2022-11-17 | Discharge: 2022-11-17 | Disposition: A | Discharge status: Home / Self Care | Payer: 59 | Source: Ambulatory Visit | Attending: Cardiology | Admitting: Cardiology

## 2022-11-17 DIAGNOSIS — E782 Mixed hyperlipidemia: Secondary | ICD-10-CM | POA: Insufficient documentation

## 2022-11-17 NOTE — Progress Notes (Signed)
Calcium score is 0, which is good. However, cholesterol is still quite high. Discuss further during appt with the pharmacist next month.  Thanks MJP

## 2022-11-26 ENCOUNTER — Ambulatory Visit: Payer: 59 | Admitting: Cardiology

## 2022-11-28 ENCOUNTER — Other Ambulatory Visit: Payer: Self-pay | Admitting: Cardiology

## 2022-11-28 ENCOUNTER — Other Ambulatory Visit: Payer: Self-pay | Admitting: Internal Medicine

## 2022-12-15 NOTE — Progress Notes (Unsigned)
Patient ID: Andres Ortiz                 DOB: 1980-11-19                    MRN: [id number]      HPI: Andres Ortiz is a 42 y.o. male patient referred to lipid clinic by Dr. Rosemary Holms. PMH is significant for HLD, HTN, fatty liver, pre-DM. Patient [employer] organ[employer] company, as well as a gym. He stays active with his physical activity.  Patient was last seen on 11/11/22 by Dr. Rosemary Holms. enied any complaints of chest pain or shortness of breath. Patient reported pain in his left shoulder and left knee, for which he is seeing an orthopedic surgeon. He mentioned his a significant increase in pain of an 8 the day before. Reported drinking alcohol heavily over the 3-4 days prior to appointment because of some family get together/parties. He has not tolerated statins in the past, but made significant changes to his diet recently.   Patient presents to lipid clinic today. Patients most recent lipid panel (11/11/22) was significant for TC 277, TG 175, HDL 55, and LDL189. His calcium score on 11/11/22 resulted as 0. Patient reported earlier this year that he did not want to try injectables due to potential side effect of ED. (***?)  Reviewed options for lowering LDL cholesterol, including, PCSK-9 inhibitors, bempedoic acid and inclisiran.  Discussed mechanisms of action, dosing, side effects and potential decreases in LDL cholesterol.  Also reviewed cost information and potential options for patient assistance.   Current Medications: none (?) Intolerances: rosuvastatin and atorvastatin (myalgia?) Risk Factors:  LDL-C goal: <70 ApoB goal:   Diet: ***  Exercise: ***  Family History: Mother: HTN, arthritis; Meternal grandmother: arthritis, DM  Social History: *** alcohol?  Labs: Lipid Panel     Component Value Date/Time   CHOL 277 (H) 11/11/2022 1126   TRIG 175 (H) 11/11/2022 1126   HDL 55 11/11/2022 1126   CHOLHDL 5.0 11/11/2022 1126   CHOLHDL 6.2 (H) 02/19/2016 1136   VLDL  62 (H) 02/19/2016 1136   LDLCALC 189 (H) 11/11/2022 1126   LABVLDL 33 11/11/2022 1126    Past Medical History:  Diagnosis Date   BMI 34.0-34.9,adult    Hypertension    Irregular heart rate    "They told me to always mention it but I don't know what it was."   Low back pain     Current Outpatient Medications on File Prior to Visit  Medication Sig Dispense Refill   Acetaminophen Extra Strength 500 MG CAPS Take 2 capsules by mouth every 8 (eight) hours.     amLODipine (NORVASC) 10 MG tablet TAKE 1 TABLET BY MOUTH EVERY DAY 90 tablet 2   celecoxib (CELEBREX) 100 MG capsule Take 100 mg by mouth 2 (two) times daily.     chlorthalidone (HYGROTON) 25 MG tablet TAKE 1 TABLET (25 MG TOTAL) BY MOUTH DAILY. 90 tablet 3   [facility] ASPIRIN ADULT LOW DOSE 81 MG chewable tablet Chew 81 mg by mouth 2 (two) times daily.     lisinopril (ZESTRIL) 40 MG tablet TAKE 1 TABLET BY MOUTH EVERY DAY 90 tablet 3   tadalafil (CIALIS) 10 MG tablet Take 1 tablet (10 mg total) by mouth as needed for erectile dysfunction. 20 tablet 1   valACYclovir (VALTREX) 500 MG tablet Take 500 mg by mouth 2 (two) times daily as needed.     No current facility-administered medications on file  prior to visit.    Allergies  Allergen Reactions   Statins Other (See Comments)    ED and severe myalgias    Assessment/Plan:  1. Hyperlipidemia -  No problem-specific Assessment & Plan notes found for this encounter. Assessment: Patient has previous intolerances to statins (atorvastatin and rosuvastatin)  Diet *** Exercise *** Dicussed Repatha and Nexlizet   Plan: Send PA for Repatha Labs in 2-3 months following Repatha start date   Thank you,  Minette Brine, Student Pharmacist  Olene Floss, Pharm.D, BCACP, BCPS, CPP [facility] Division of [facility] [address] [place], [place] [address]  Phone: [phone]; Fax: [phone]

## 2022-12-16 ENCOUNTER — Ambulatory Visit: Payer: 59 | Attending: Cardiology

## 2022-12-17 ENCOUNTER — Other Ambulatory Visit: Payer: Self-pay | Admitting: Internal Medicine

## 2022-12-17 ENCOUNTER — Encounter: Payer: Self-pay | Admitting: Cardiology

## 2023-02-04 ENCOUNTER — Encounter: Payer: Self-pay | Admitting: Student

## 2023-02-04 ENCOUNTER — Telehealth: Payer: Self-pay | Admitting: Pharmacist

## 2023-02-04 ENCOUNTER — Ambulatory Visit: Payer: 59 | Attending: Cardiovascular Disease | Admitting: Student

## 2023-02-04 VITALS — BP 150/90 | HR 82

## 2023-02-04 DIAGNOSIS — E782 Mixed hyperlipidemia: Secondary | ICD-10-CM | POA: Diagnosis not present

## 2023-02-04 DIAGNOSIS — E785 Hyperlipidemia, unspecified: Secondary | ICD-10-CM | POA: Diagnosis not present

## 2023-02-04 DIAGNOSIS — I10 Essential (primary) hypertension: Secondary | ICD-10-CM | POA: Diagnosis not present

## 2023-02-04 MED ORDER — EZETIMIBE 10 MG PO TABS: 10.0000 mg | ORAL_TABLET | Freq: Every day | ORAL | 3 refills | Status: DC

## 2023-02-04 NOTE — Patient Instructions (Addendum)
 Your Results:             Your most recent labs Goal  Total Cholesterol 277  < 200  Triglycerides 175  < 150  HDL (happy/good cholesterol) 55 > 40  LDL (lousy/bad cholesterol 189 < 100  to <70    Medication changes: start taking ezetimibe  10 mg daily  We will start the process to get PCSK9i (Repatha  or Praluent)  covered by your insurance.  Once the prior authorization is complete, we will call you to let you know and confirm pharmacy information.    Praluent is a cholesterol medication that improved your body's ability to get rid of bad cholesterol known as LDL. It can lower your LDL up to 60%. It is an injection that is given under the skin every 2 weeks. The most common side effects of Praluent include runny nose, symptoms of the common cold, rarely flu or flu-like symptoms, back/muscle pain in about 3-4% of the patients, and redness, pain, or bruising at the injection site.    Repatha  is a cholesterol medication that improved your body's ability to get rid of bad cholesterol known as LDL. It can lower your LDL up to 60%! It is an injection that is given under the skin every 2 weeks. The medication often requires a prior authorization from your insurance company. We will take care of submitting all the necessary information to your insurance company to get it approved. The most common side effects of Repatha  include runny nose, symptoms of the common cold, rarely flu or flu-like symptoms, back/muscle pain in about 3-4% of the patients, and redness, pain, or bruising at the injection site.   Lab orders: We want to repeat labs after 2-3 months.  We will send you a lab order to remind you once we get closer to that time.

## 2023-02-04 NOTE — Assessment & Plan Note (Addendum)
 Assessment: BP is uncontrolled in office BP 150/90 mmHg  heart rate 82 (goal <130/80) Takes lisinopril  and amlodipine  regularly  Not adherent with chlorthalidone  - was taking at night that was affecting sleep with nocturia  Denies SOB, palpitation, chest pain, headaches,or swelling Reiterated the importance of regular exercise and low salt diet   Plan:  Start taking chlorthalidone  25 mg every morning regularly  Continue taking lisinopril  40 mg daily, amlodipine  10 mg daily  Patient does not want to add any medication today, will improve adherence on the current meds and lower salt/alcohol intake will re-start exercise  Patient to get new home BP monitor and to bring in for validation at next OV  Patient to keep record of BP readings with heart rate and report to us  at the next visit Patient to see PharmD in 4 weeks for follow up   In future can consider adding MRA to current 1st line agents;patient does not want to go on betablocker as may worsen ED problem  Follow up lab(s): CMP

## 2023-02-04 NOTE — Assessment & Plan Note (Deleted)
 Assessment:  LDL goal: <70 mg/dl last LDLc 810 mg/dl TG 824 mg/dl (89/75)  Intolerance to statins  Discussed next potential options (PCSK-9 inhibitors, bempedoic acid and inclisiran); cost, dosing efficacy, side effects    Plan: Continue taking current medications () Will apply for PA for PCSK9i; will inform patient upon approval (prefers MyChart message)  Lipid lab due in 2-3 months after starting PCSK9i
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
## 2023-02-04 NOTE — Assessment & Plan Note (Signed)
 Assessment:  LDL goal: <70 mg/dl last LDLc 810 mg/dl TG 824 mg/dl (89/75) Intolerance to statins - Crestor  40 mg, Lipitor 10 mg - weakness, joint and muscle weakness  Currently not on any lipid lowering medications  Discussed next potential options (ezetimibe , PCSK-9 inhibitors, bempedoic acid and inclisiran); cost, dosing efficacy, side effects  Restarting regular exercise, will work on improving diet   Plan: Will get baseline CMP for liver enzymes - start taking ezetimibe  10 mg daily  Will apply for PA for PCSK9i; will inform patient upon approval  Lipid lab due in 2-3 months in of therapy optimization

## 2023-02-04 NOTE — Assessment & Plan Note (Deleted)
 Current Medications: none  Intolerances: Crestor 40 mg, Lipitor 10 mg - weakness, joint and muscle weakness   Risk Factors: hypertension, prediabetes, obesity BMI 30-34.9, HLD, LDL goal: <70 mg/dl or lenient goal can be <100 mg/dl

## 2023-02-05 ENCOUNTER — Other Ambulatory Visit (HOSPITAL_COMMUNITY): Payer: Self-pay

## 2023-02-05 LAB — COMPREHENSIVE METABOLIC PANEL
ALT: 30 [IU]/L (ref 0–44)
AST: 20 [IU]/L (ref 0–40)
Albumin: 4.7 g/dL (ref 4.1–5.1)
Alkaline Phosphatase: 136 [IU]/L — ABNORMAL HIGH (ref 44–121)
BUN/Creatinine Ratio: 13 (ref 9–20)
BUN: 12 mg/dL (ref 6–24)
Bilirubin Total: 0.3 mg/dL (ref 0.0–1.2)
CO2: 24 mmol/L (ref 20–29)
Calcium: 10 mg/dL (ref 8.7–10.2)
Chloride: 104 mmol/L (ref 96–106)
Creatinine, Ser: 0.92 mg/dL (ref 0.76–1.27)
Globulin, Total: 2.3 g/dL (ref 1.5–4.5)
Glucose: 98 mg/dL (ref 70–99)
Potassium: 4.6 mmol/L (ref 3.5–5.2)
Sodium: 143 mmol/L (ref 134–144)
Total Protein: 7 g/dL (ref 6.0–8.5)
eGFR: 105 mL/min/{1.73_m2} (ref >59)

## 2023-02-05 NOTE — Progress Notes (Signed)
 ALKP is elevated, even without statins. Check lipid panel before visit with me on 1/29. I will discuss more at that time.  Thanks MJP

## 2023-02-05 NOTE — Telephone Encounter (Signed)
Pharmacy Patient Advocate Encounter   Received notification from Physician's Office that prior authorization for REPATHA is required/requested.   Insurance verification completed.   The patient is insured through [employer] [facility] .   Per test claim: The current 28 day co-pay is, $0.  No PA needed at this time. This test claim was processed through [facility]- copay amounts may vary at other pharmacies due to pharmacy/plan contracts, or as the patient moves through the different stages of their insurance plan.

## 2023-02-06 ENCOUNTER — Other Ambulatory Visit (HOSPITAL_COMMUNITY): Payer: Self-pay

## 2023-02-06 MED ORDER — REPATHA SURECLICK 140 MG/ML ~~LOC~~ SOAJ
140.0000 mg | SUBCUTANEOUS | 3 refills | Status: DC
  Filled 2023-02-06: qty 2, 28d supply, fill #0

## 2023-02-06 NOTE — Telephone Encounter (Signed)
 Spoke to patient, discussed CMP and Lipid lab. Advised to d/c ezetimibe, cut down on alcohol intake and Repatha (evolocumab) is not likely to affect liver enzymes patient will start using Repatha follow up lab due March 31,2025

## 2023-02-06 NOTE — Telephone Encounter (Signed)
Thanks MJP  

## 2023-02-11 ENCOUNTER — Other Ambulatory Visit: Payer: Self-pay | Admitting: Internal Medicine

## 2023-02-18 ENCOUNTER — Other Ambulatory Visit (HOSPITAL_COMMUNITY): Payer: Self-pay

## 2023-02-25 ENCOUNTER — Encounter: Payer: Self-pay | Admitting: Cardiology

## 2023-02-25 ENCOUNTER — Ambulatory Visit: Payer: 59 | Attending: Cardiology | Admitting: Cardiology

## 2023-02-25 VITALS — BP 136/86 | HR 88 | Resp 16 | Ht 69.0 in | Wt 223.6 lb

## 2023-02-25 DIAGNOSIS — R748 Abnormal levels of other serum enzymes: Secondary | ICD-10-CM | POA: Diagnosis not present

## 2023-02-25 DIAGNOSIS — E782 Mixed hyperlipidemia: Secondary | ICD-10-CM

## 2023-02-25 DIAGNOSIS — I10 Essential (primary) hypertension: Secondary | ICD-10-CM | POA: Diagnosis not present

## 2023-02-25 NOTE — Patient Instructions (Addendum)
Follow-Up: At [facility], you and your health needs are our priority.  As part of our continuing mission to provide you with exceptional heart care, we have created designated Provider Care Teams.  These Care Teams include your primary Cardiologist (physician) and Advanced Practice Providers (APPs -  Physician Assistants and Nurse Practitioners) who all work together to provide you with the care you need, when you need it.  We recommend signing up for the patient portal called "MyChart".  Sign up information is provided on this After Visit Summary.  MyChart is used to connect with patients for Virtual Visits (Telemedicine).  Patients are able to view lab/test results, encounter notes, upcoming appointments, etc.  Non-urgent messages can be sent to your provider as well.   To learn more about what you can do with MyChart, go to [email or web address].    Your next appointment:   1 year(s)  Provider:   Elder Negus, MD     Other Instructions   1st Floor: - Lobby - Registration  - Pharmacy  - Lab - Cafe  2nd Floor: - PV Lab - Diagnostic Testing (echo, CT, nuclear med)  3rd Floor: - Vacant  4th Floor: - TCTS (cardiothoracic surgery) - AFib Clinic - Structural Heart Clinic - Vascular Surgery  - Vascular Ultrasound  5th Floor: - HeartCare Cardiology (general and EP) - Clinical Pharmacy for coumadin, hypertension, lipid, weight-loss medications, and med management appointments    Valet parking services will be available as well.

## 2023-02-25 NOTE — Progress Notes (Signed)
  Cardiology Office Note:  .   Date:  02/25/2023  ID:  DIVON KRABILL, DOB July 18, 1980, MRN [id number] PCP: [name], MD  [facility] Providers Cardiologist:  Truett Mainland, MD PCP: [name], MD  Chief Complaint  Patient presents with   Essential hypertension   Follow-up      History of Present Illness: .    Andres Ortiz is a 43 y.o. male with hypertension, hyperlipidemia, prediabetes   Patient was recently seen in pharmacy clinic and started on Zetia 10 mg daily.  Baseline CMP do show a mildly elevated ALKP.  Given this, Zetia was stopped.  PA application for PCSK9i is in process.   Since his last visit with me, he has cut down alcohol intake.  Blood pressure is now much better controlled.   Vitals:   02/25/23 0903  BP: 136/86  Pulse: 88  Resp: 16  SpO2: 96%     ROS:  Review of Systems  Cardiovascular:  Negative for chest pain, dyspnea on exertion, leg swelling, palpitations and syncope.     Studies Reviewed: Marland Kitchen        Independently interpreted 01/2023: Cr 0.92 AlKP 136  10/2022: Chol 277, TG 175, HDL 55, LDL 189  10/2022: Calcium score 0    Physical Exam:   Physical Exam Vitals and nursing note reviewed.  Constitutional:      General: He is not in acute distress. Neck:     Vascular: No JVD.  Cardiovascular:     Rate and Rhythm: Normal rate and regular rhythm.     Heart sounds: Normal heart sounds. No murmur heard. Pulmonary:     Effort: Pulmonary effort is normal.     Breath sounds: Normal breath sounds. No wheezing or rales.  Musculoskeletal:     Right lower leg: No edema.     Left lower leg: No edema.      VISIT DIAGNOSES:   ICD-10-CM   1. Essential hypertension  I10 EKG 12-Lead    2. Mixed hyperlipidemia  E78.2     3. Elevated liver enzymes  R74.8        ASSESSMENT AND PLAN: .    Andres Ortiz is a 43 y.o. male with hypertension, hyperlipidemia, prediabetes   Hypertension: Now much better  controlled after reducing alcohol intake. Continue amlodipine 10 mg daily, chlorthalidone 25 mg daily, lisinopril 40 mg daily.   Mixed hyperlipidemia: Lipids remain uncontrolled with cholesterol 277, LDL 189. Elevated alkaline phosphatase, he has not on Zetia or statin. PA application for PCSK9i pending. Continue f/u w/lipid clinic.    F/u in 1 year  Signed, Elder Negus, MD

## 2023-03-13 ENCOUNTER — Ambulatory Visit: Payer: 59

## 2023-04-02 ENCOUNTER — Other Ambulatory Visit (HOSPITAL_COMMUNITY): Payer: Self-pay

## 2023-04-28 NOTE — Assessment & Plan Note (Signed)
 Assessment: Last office BP 136/86 mmHg (02/25/23) Today's BP:  Plan: MRA if not at goal
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
## 2023-04-30 ENCOUNTER — Ambulatory Visit: Payer: 59 | Attending: Cardiology | Admitting: Pharmacist

## 2023-04-30 VITALS — BP 160/94 | HR 74

## 2023-04-30 DIAGNOSIS — E785 Hyperlipidemia, unspecified: Secondary | ICD-10-CM

## 2023-04-30 DIAGNOSIS — I10 Essential (primary) hypertension: Secondary | ICD-10-CM | POA: Diagnosis not present

## 2023-04-30 MED ORDER — AMLODIPINE BESYLATE 10 MG PO TABS: 10.0000 mg | ORAL_TABLET | Freq: Every day | ORAL | 3 refills | Status: DC

## 2023-04-30 MED ORDER — REPATHA SURECLICK 140 MG/ML ~~LOC~~ SOAJ: 140.0000 mg | SUBCUTANEOUS | 3 refills | Status: AC

## 2023-04-30 NOTE — Assessment & Plan Note (Addendum)
 Assessment: LDL 189 not at goal of < 70 mg/dl. Repatha has not been started yet due to prescription request being sent to cardiology office that is no longer open.   Plan:  Sent new Repatha prescription to pharmacy. Reviewed injection technique and side effects - patient is comfortable with technique. Will repeat lipid panel in June after next PharmD follow-up.

## 2023-04-30 NOTE — Patient Instructions (Addendum)
 Your blood pressure goal is < 130/73mmHg   Please start checking blood pressure daily Please send me readings in 1 week Continue lisinopril 40 mg daily and chlorthalidone 25 mg daily for now and we will determine if we still need amlodipine Decrease ibuprofen intake Continue to decrease alcohol Continue exercise Start taking Repatha every 14 days  Important lifestyle changes to control high blood pressure  Intervention  Effect on the BP   Weight loss Weight loss is one of the most effective lifestyle changes for controlling blood pressure. If you're overweight or obese, losing even a small amount of weight can help reduce blood pressure.    Blood pressure can decrease by 1 millimeter of mercury (mmHg) with each kilogram (about 2.2 pounds) of weight lost.   Exercise regularly As a general goal, aim for 30 minutes of moderate physical activity every day.    Regular physical activity can lower blood pressure by 5 - 8 mmHg.   Eat a healthy diet Eat a diet rich in whole grains, fruits, vegetables, lean meat, and low-fat dairy products. Limit processed foods, saturated fat, and sweets.    A heart-healthy diet can lower high blood pressure by 10 mmHg.   Reduce salt (sodium) in your diet Aim for 000mg  of sodium each day. Avoid deli meats, canned food, and frozen microwave meals which are high in sodium.     Limiting sodium can reduce blood pressure by 5 mmHg.   Limit alcohol One drink equals 12 ounces of beer, 5 ounces of wine, or 1.5 ounces of 80-proof liquor.    Limiting alcohol to < 1 drink a day for women or < 2 drinks a day for men can help lower blood pressure by about 4 mmHg.   To check your pressure at home you will need to:   Sit up in a chair, with feet flat on the floor and back supported. Do not cross your ankles or legs. Rest your left arm so that the cuff is about heart level. If the cuff goes on your upper arm, then just relax your arm on the table, arm of the  chair, or your lap. If you have a wrist cuff, hold your wrist against your chest at heart level. Place the cuff snugly around your arm, about 1 inch above the crease of your elbow. The cords should be inside the groove of your elbow.  Sit quietly, with the cuff in place, for about 5 minutes. Then press the power button to start a reading. Do not talk or move while the reading is taking place.  Record your readings on a sheet of paper. Although most cuffs have a memory, it is often easier to see a pattern developing when the numbers are all in front of you.  You can repeat the reading after 1-3 minutes if it is recommended.   Make sure your bladder is empty and you have not had caffeine or tobacco within the last 30 minutes   Always bring your blood pressure log with you to your appointments. If you have not brought your monitor in to be double checked for accuracy, please bring it to your next appointment.   You can find a list of validated (accurate) blood pressure cuffs at: validatebp.org

## 2023-05-11 ENCOUNTER — Encounter: Payer: Self-pay | Admitting: Pharmacist

## 2023-05-22 NOTE — Telephone Encounter (Signed)
 Spoke with patient. He was able to get Repatha  and has taken 2 injections so far. He has been checking blood pressure and writing it down, but hasn't sent yet. Will have his wife send today.

## 2023-07-01 ENCOUNTER — Ambulatory Visit: Attending: Pharmacist | Admitting: Pharmacist
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
## 2023-07-18 ENCOUNTER — Other Ambulatory Visit: Payer: Self-pay | Admitting: Internal Medicine

## 2023-07-18 DIAGNOSIS — I10 Essential (primary) hypertension: Secondary | ICD-10-CM

## 2023-08-24 ENCOUNTER — Ambulatory Visit: Attending: Internal Medicine | Admitting: Pharmacist

## 2023-08-24 VITALS — BP 166/120 | HR 77

## 2023-08-24 DIAGNOSIS — I10 Essential (primary) hypertension: Secondary | ICD-10-CM

## 2023-08-24 MED ORDER — AMLODIPINE BESYLATE 10 MG PO TABS: 5.0000 mg | ORAL_TABLET | Freq: Every day | ORAL | 3 refills | Status: AC
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
## 2023-08-24 NOTE — Assessment & Plan Note (Signed)
 Assessment: Blood pressure significantly elevated in clinic today Patient reported better blood pressures at home but did not bring in any list We discussed the long-term negative effects of excessive alcohol use and marijuana use on his health We discussed both the long-term and short-term risks of high blood pressure including stroke, heart attack, heart failure and kidney failure  Plan: Resume amlodipine  at 5 mg daily Start checking blood pressure daily and bring in both his log and his blood pressure cuff to next visit Follow-up in 4 weeks Patient instructed to call me if no improvement in blood pressure

## 2023-08-24 NOTE — Patient Instructions (Addendum)
 Please resume taking amlodipine  5mg  daily (1/2 tablet) Continue lisinopril  40 mg daily, chlorthalidone  25 mg daily  Your blood pressure goal is < 130/82mmHg   Please bring in your blood pressure readings and machine to next visit  Important lifestyle changes to control high blood pressure  Intervention  Effect on the BP   Weight loss Weight loss is one of the most effective lifestyle changes for controlling blood pressure. If you're overweight or obese, losing even a small amount of weight can help reduce blood pressure.    Blood pressure can decrease by 1 millimeter of mercury (mmHg) with each kilogram (about 2.2 pounds) of weight lost.   Exercise regularly As a general goal, aim for 30 minutes of moderate physical activity every day.    Regular physical activity can lower blood pressure by 5 - 8 mmHg.   Eat a healthy diet Eat a diet rich in whole grains, fruits, vegetables, lean meat, and low-fat dairy products. Limit processed foods, saturated fat, and sweets.    A heart-healthy diet can lower high blood pressure by 10 mmHg.   Reduce salt (sodium) in your diet Aim for 000mg  of sodium each day. Avoid deli meats, canned food, and frozen microwave meals which are high in sodium.     Limiting sodium can reduce blood pressure by 5 mmHg.   Limit alcohol One drink equals 12 ounces of beer, 5 ounces of wine, or 1.5 ounces of 80-proof liquor.    Limiting alcohol to < 1 drink a day for women or < 2 drinks a day for men can help lower blood pressure by about 4 mmHg.   To check your pressure at home you will need to:   Sit up in a chair, with feet flat on the floor and back supported. Do not cross your ankles or legs. Rest your left arm so that the cuff is about heart level. If the cuff goes on your upper arm, then just relax your arm on the table, arm of the chair, or your lap. If you have a wrist cuff, hold your wrist against your chest at heart level. Place the cuff snugly  around your arm, about 1 inch above the crease of your elbow. The cords should be inside the groove of your elbow.  Sit quietly, with the cuff in place, for about 5 minutes. Then press the power button to start a reading. Do not talk or move while the reading is taking place.  Record your readings on a sheet of paper. Although most cuffs have a memory, it is often easier to see a pattern developing when the numbers are all in front of you.  You can repeat the reading after 1-3 minutes if it is recommended.   Make sure your bladder is empty and you have not had caffeine or tobacco within the last 30 minutes   Always bring your blood pressure log with you to your appointments. If you have not brought your monitor in to be double checked for accuracy, please bring it to your next appointment.   You can find a list of validated (accurate) blood pressure cuffs at: validatebp.org

## 2023-09-16 ENCOUNTER — Encounter: Payer: Self-pay | Admitting: Pharmacist

## 2023-09-16 ENCOUNTER — Ambulatory Visit: Attending: Cardiology | Admitting: Pharmacist

## 2023-09-16 VITALS — BP 146/102 | HR 81

## 2023-09-16 DIAGNOSIS — I10 Essential (primary) hypertension: Secondary | ICD-10-CM | POA: Diagnosis not present

## 2023-09-16 MED ORDER — LISINOPRIL 40 MG PO TABS: 40.0000 mg | ORAL_TABLET | Freq: Every day | ORAL | 3 refills | Status: AC

## 2023-09-16 NOTE — Assessment & Plan Note (Signed)
 Assessment: Clinic blood pressure is significantly elevated Home readings are much better with validated blood pressure cuff Has been off of lisinopril  due to running out and not being able to get a refill Has increased his exercise Has been increasing his intake of spinach and beets and other fruits and vegetables  Plan: Resume lisinopril  40 mg daily-Rx sent to pharmacy Continue amlodipine  5 mg daily and chlorthalidone  25 mg daily Continue to check blood pressure at home Advised for him to cut back even more on alcohol and marijuana-complete cessation was recommended Follow-up in 1 month

## 2023-09-16 NOTE — Patient Instructions (Addendum)
 Resume taking lisinopril  40mg  daily Continue chlorthalidone  25 mg daily, amlodipine  5mg  daily  Continue to decrease alcohol intake and cut back on/stop marijuana use  Continue checking blood pressure daily and record the readings.

## 2023-09-16 NOTE — Progress Notes (Signed)
 Patient ID: JOSEMANUEL EAKINS                 DOB: 1980-06-23                    MRN: [id number]      HPI: Andres Ortiz is a 43 y.o. male patient referred to lipid/HTN clinic by Dr. Elmira. PMH is significant for hypertension, prediabetes, obesity BMI 30-34.9, HLD, chronic low back pain.   At pharmacy visit 1/8 chlorthalidone  25 mg was added to HTN regimen.  At last visit with cardiologist 02/25/23, patient was initiated on ezetimibe  due to LDL 189 not at goal of < 70 mg/dl. Ezetimibe  was d/c'd on 02/06/23 due to elevated Alk Phos. Patient advised to cut down on alcohol and Repatha  was initiated.  I last saw him 04/30/23. BP was 160/94. He had been out of amlodipine  for 3 weeks. His home readings off of amlodipine  were better. He had reported decrease in ETOH intake. He was advised to stop IBU. He had not started Repatha . Patient was  send me home readings in 1 week.  He was to resume amlodipine  it BP above goal. I called pt to follow up when no readings were received. He stated he had started Repatha  and would have his wife send me readings, but none were received. He no showed his last appointment with me.   Last seen 08/24/23. BP was 166/120 in office. He reported controlled home BP ~120/60. Had not been taking his amlodipine . I asked him to resume amlodipine  at 5mg  daily.   Patient presents today for follow-up.  Reports he has been out of lisinopril  for about 1-1/2 to 2 weeks.  Appears as though refill request was probably sent to his old cardiologist and not to us .  Home blood pressure readings are as follows:   128/82- 8/19 117/75-8/17 127/76-8/15 137/90-8/13  He reports cutting back on alcohol to only the weekends and a few shots.  Reports that he has been.  Still smoking marijuana daily.  Has been increasing his exercise this and increasing his intake of fruits and vegetables (spinach and beets in particular he mentions). His wife quit drinking altogether which has helped him  cut back.   128/82- 8/19 117/75-8/17 127/76-8/15 137/90-8/13  He brought in his home blood pressure cuff which was found to be accurate Home BP cuff-149/96, clinic reading 146/102   Current HLD medications: Repatha  140mg  q 14 days Intolerances: Crestor  40 mg, Lipitor 10 mg (weakness, joint and muscle weakness); ezetimibe  (elevated liver enzymes)  Risk Factors: hypertension, prediabetes, obesity BMI 30-34.9, HLD LDL goal: <70 mg/dl   Current BP medications: lisinopril  40 mg daily (has been out of for almost 2 weeks), chlorthalidone  25 mg daily, amlodipine  5mg  daily Previous BP medications: hydrochlorothiazide  25 mg daily (headache), Bidil 20-37.5 mg TID BP goal: <130/80 mmHg  Diet: eats mainly home cooked meals - low salt and low fat  Breakfast: eggs, smoothies w/ flax/chia Lunch: almonds, fruit, protein shakes, bread Dinner: baked chicken (not fried), fruits, vegetables Snacks: sweet potato pie, brownies, snack cakes (indulges after smoking) Drinks: 12-13 cups water/day  Exercise:  Walking daily Back in the gym   Family History:  Relation Problem Comments  Mother Metallurgist) Arthritis   Hypertension     Father Metallurgist)   Sister (Alive)   Maternal Uncle Arthritis     Paternal Aunt Muscular dystrophy     Paternal Uncle Muscular dystrophy     Maternal Grandmother Arthritis  Diabetes       Social History:  Alcohol: has been drinking a lot to cope with stress- hasn't drank in 2 weeks (other than 2 glasses of wine last night) Smoking: quit 35 years ago  Recreational drug use: smokes blunts every day after work  Home BP readings:  128/82- 8/19 117/75-8/17 127/76-8/15 137/90-8/13   Labs: Lipid Panel     Component Value Date/Time   CHOL 277 (H) 11/11/2022 1126   TRIG 175 (H) 11/11/2022 1126   HDL 55 11/11/2022 1126   CHOLHDL 5.0 11/11/2022 1126   CHOLHDL 6.2 (H) 02/19/2016 1136   VLDL 62 (H) 02/19/2016 1136   LDLCALC 189 (H) 11/11/2022 1126   LABVLDL 33  11/11/2022 1126    Past Medical History:  Diagnosis Date   BMI 34.0-34.9,adult    Hypertension    Irregular heart rate    They told me to always mention it but I don't know what it was.   Low back pain     Current Outpatient Medications on File Prior to Visit  Medication Sig Dispense Refill   Acetaminophen  Extra Strength 500 MG CAPS Take 2 capsules by mouth every 8 (eight) hours. (Patient not taking: Reported on 04/30/2023)     amLODipine  (NORVASC ) 10 MG tablet Take 0.5 tablets (5 mg total) by mouth daily. 45 tablet 3   chlorthalidone  (HYGROTON ) 25 MG tablet TAKE 1 TABLET (25 MG TOTAL) BY MOUTH DAILY. 90 tablet 3   Evolocumab  (REPATHA  SURECLICK) 140 MG/ML SOAJ Inject 140 mg into the skin every 14 (fourteen) days. 6 mL 3   ezetimibe  (ZETIA ) 10 MG tablet Take 1 tablet (10 mg total) by mouth daily. (Patient not taking: Reported on 04/30/2023) 90 tablet 3   tadalafil  (CIALIS ) 10 MG tablet Take 1 tablet (10 mg total) by mouth as needed for erectile dysfunction. 20 tablet 1   valACYclovir (VALTREX) 500 MG tablet Take 500 mg by mouth 2 (two) times daily as needed.     No current facility-administered medications on file prior to visit.    Allergies  Allergen Reactions   Statins Other (See Comments)    ED and severe myalgias    Assessment/Plan:  1. Hyperlipidemia -  Problem  Essential Hypertension      Essential hypertension Assessment: Clinic blood pressure is significantly elevated Home readings are much better with validated blood pressure cuff Has been off of lisinopril  due to running out and not being able to get a refill Has increased his exercise Has been increasing his intake of spinach and beets and other fruits and vegetables  Plan: Resume lisinopril  40 mg daily-Rx sent to pharmacy Continue amlodipine  5 mg daily and chlorthalidone  25 mg daily Continue to check blood pressure at home Advised for him to cut back even more on alcohol and marijuana-complete cessation  was recommended Follow-up in 1 month    Thank you,  Kourtnee Lahey D Pierre Cumpton, Pharm.JONETTA SARAN, CPP [facility] Division of [facility] [address], [place], [place] [address]  Phone: [phone]; Fax: [phone]

## 2023-10-20 ENCOUNTER — Ambulatory Visit: Attending: Cardiology | Admitting: Pharmacist

## 2023-10-20 VITALS — BP 130/100 | HR 83

## 2023-10-20 DIAGNOSIS — I10 Essential (primary) hypertension: Secondary | ICD-10-CM

## 2023-10-20 MED ORDER — CARVEDILOL 3.125 MG PO TABS: 3.1250 mg | ORAL_TABLET | Freq: Two times a day (BID) | ORAL | 3 refills | Status: DC

## 2023-10-20 NOTE — Assessment & Plan Note (Addendum)
 Assessment: BP is not currently at goal <130/80 In clinic BP 140/96 with HR 83 on amlodipine  10 mg, lisinopril  40 mg, and chlorthalidone  25 mg Home BP readings not at goal - avg 120s/80s with Hrs 100s Denies any side effects with medications, specifically amlodipine  10 mg HR is consistently elevated which can progress to cardiomyopathy. Recommend starting carvedilol  to help lower HR Patient wants to monitor HR at home for a few days to see if it decreases. If not, patient will start taking carvedilol  Initiate carvedilol  3.125 mg BID with meals Continue checking BP at home with BP log. Bring into next appointment  Plan: Initiate carvedilol  3.125 mg BID with meals Continue amlodipine  10 mg, lisinopril  40 mg, chlorthalidone  25 mg Continue to check BP and HR at home with BP log. Follow up in 1 month

## 2023-10-20 NOTE — Patient Instructions (Addendum)
 Your blood pressure goal is < 130/35mmHg   Start carvedilol  3.125 mg twice daily with meals. Continue lisinopril  40 mg, amlodipine  10 mg, and chlorthalidone  25 mg daily.  Continue to check your blood pressure at home. Write down blood pressures and bring them to your next appointment.  Important lifestyle changes to control high blood pressure  Intervention  Effect on the BP   Weight loss Weight loss is one of the most effective lifestyle changes for controlling blood pressure. If you're overweight or obese, losing even a small amount of weight can help reduce blood pressure.    Blood pressure can decrease by 1 millimeter of mercury (mmHg) with each kilogram (about 2.2 pounds) of weight lost.   Exercise regularly As a general goal, aim for 30 minutes of moderate physical activity every day.    Regular physical activity can lower blood pressure by 5 - 8 mmHg.   Eat a healthy diet Eat a diet rich in whole grains, fruits, vegetables, lean meat, and low-fat dairy products. Limit processed foods, saturated fat, and sweets.    A heart-healthy diet can lower high blood pressure by 10 mmHg.   Reduce salt (sodium) in your diet Aim for 000mg  of sodium each day. Avoid deli meats, canned food, and frozen microwave meals which are high in sodium.     Limiting sodium can reduce blood pressure by 5 mmHg.   Limit alcohol One drink equals 12 ounces of beer, 5 ounces of wine, or 1.5 ounces of 80-proof liquor.    Limiting alcohol to < 1 drink a day for women or < 2 drinks a day for men can help lower blood pressure by about 4 mmHg.   To check your pressure at home you will need to:   Sit up in a chair, with feet flat on the floor and back supported. Do not cross your ankles or legs. Rest your left arm so that the cuff is about heart level. If the cuff goes on your upper arm, then just relax your arm on the table, arm of the chair, or your lap. If you have a wrist cuff, hold your wrist  against your chest at heart level. Place the cuff snugly around your arm, about 1 inch above the crease of your elbow. The cords should be inside the groove of your elbow.  Sit quietly, with the cuff in place, for about 5 minutes. Then press the power button to start a reading. Do not talk or move while the reading is taking place.  Record your readings on a sheet of paper. Although most cuffs have a memory, it is often easier to see a pattern developing when the numbers are all in front of you.  You can repeat the reading after 1-3 minutes if it is recommended.   Make sure your bladder is empty and you have not had caffeine or tobacco within the last 30 minutes   Always bring your blood pressure log with you to your appointments. If you have not brought your monitor in to be double checked for accuracy, please bring it to your next appointment.   You can find a list of validated (accurate) blood pressure cuffs at: validatebp.org

## 2023-10-20 NOTE — Progress Notes (Signed)
 Patient ID: Andres Ortiz                 DOB: 19-Mar-1980                      MRN: [id number]      HPI: Andres Ortiz is a 43 y.o. male referred by Dr. Elmira to HTN/lipid clinic. PMH is significant for hypertension, prediabetes, obesity BMI 30-34.9, HLD, chronic low back pain.   At pharmacy visit 1/8 chlorthalidone  25 mg was added to HTN regimen. Pharmacy saw him 04/30/23. BP was 160/94. He had been out of amlodipine  for 3 weeks. His home readings off of amlodipine  were better. He had reported decrease in ETOH intake. He was advised to stop IBU. He had not started Repatha . Patient was send me home readings in 1 week.  He was to resume amlodipine  it BP above goal. PharmD called pt to follow up when no readings were received. He stated he had started Repatha  and would have his wife send me readings, but none were received. He no showed his last appointment with me.    Patient saw pharmacy 08/24/23. BP was 166/120 in office. He reported controlled home BP ~120/60. Had not been taking his amlodipine . PharmD asked him to resume amlodipine  at 5mg  daily.   Last seen on 09/16/23. Patient was out of lisinopril  for 1 to 1.5 weeks as refill request was sent to the wrong doctor. Home readings were within goal. Home BP machine was brought into clinic. In clinic BP and home BP readings were similar at 140s/90-100s.  Today, patient presents for HTN follow up. He states that he's doing well. He reports that he has been taking his medications as directed, typically in the morning. He self titrated amlodipine  to 10 mg daily. He has been taking this for ~1 week. During the last month, he has missed 3-4 days of medication.   Reports working out more during the week than before. Works out in [employer] and at home about 5 days a week. Walks in the evenings with daughter about 2 miles a day. Increased fruits and veggies (broccoli) into diet. Does not eat out at all.  He checks his BP in the evenings after  work, walking, or completing a task. This is typically when he remembers. He states that he will wait a few minutes before checking his BP. Readings listed below:  127/85 119/80 107 118/80 108 92/76 100 Some HR in 80s-90s  In-clinic BP elevated 140/96 with HR 83. Heart rates are consistently high. Educated on repercussions of having elevated heart rate overtime can lead to development of cardiomyopathy. Patient amendable to starting carvedilol  to reduce HR.  Current HTN meds: lisinopril  40 mg daily,  chlorthalidone  25 mg daily, amlodipine  10 mg daily  Previously tried: hydrochlorothiazide  25 mg daily (headache), Bidil 20-37.5 mg TID  BP goal: <130/80  Family History:  Family History  Problem Relation Age of Onset   Hypertension Mother    Arthritis Mother    Arthritis Maternal Uncle    Muscular dystrophy Paternal Aunt    Arthritis Maternal Grandmother    Diabetes Maternal Grandmother    Muscular dystrophy Paternal Uncle     Social History: cut back on alcohol, still smoking marijuana  Diet: eats mainly home cooked meals - low salt and low fat  Breakfast: eggs, smoothies w/ flax/chia Lunch: almonds, fruit, protein shakes, bread Dinner: baked chicken (not fried), fruits, vegetables Snacks: fruits Drinks: 12-13 cups  water/day  Exercise:  Walking daily (2 miles everyday) Back in the gym 3 days a week  Home BP readings:  127/85 119/80 107 118/80 108 92/76 100 Some HR in 80s-90s   Wt Readings from Last 3 Encounters:  02/25/23 223 lb 9.6 oz (101.4 kg)  11/11/22 216 lb 6.4 oz (98.2 kg)  06/08/22 225 lb (102.1 kg)   BP Readings from Last 3 Encounters:  10/20/23 (!) 130/100  09/16/23 (!) 146/102  08/24/23 (!) 166/120   Pulse Readings from Last 3 Encounters:  10/20/23 83  09/16/23 81  08/24/23 77    Renal function: CrCl cannot be calculated (Patient's most recent lab result is older than the maximum 21 days allowed.).  Past Medical History:  Diagnosis Date    BMI 34.0-34.9,adult    Hypertension    Irregular heart rate    They told me to always mention it but I don't know what it was.   Low back pain     Current Outpatient Medications on File Prior to Visit  Medication Sig Dispense Refill   Acetaminophen  Extra Strength 500 MG CAPS Take 2 capsules by mouth every 8 (eight) hours. (Patient not taking: Reported on 04/30/2023)     amLODipine  (NORVASC ) 10 MG tablet Take 0.5 tablets (5 mg total) by mouth daily. 45 tablet 3   chlorthalidone  (HYGROTON ) 25 MG tablet TAKE 1 TABLET (25 MG TOTAL) BY MOUTH DAILY. 90 tablet 3   Evolocumab  (REPATHA  SURECLICK) 140 MG/ML SOAJ Inject 140 mg into the skin every 14 (fourteen) days. 6 mL 3   ezetimibe  (ZETIA ) 10 MG tablet Take 1 tablet (10 mg total) by mouth daily. (Patient not taking: Reported on 04/30/2023) 90 tablet 3   lisinopril  (ZESTRIL ) 40 MG tablet Take 1 tablet (40 mg total) by mouth daily. 90 tablet 3   tadalafil  (CIALIS ) 10 MG tablet Take 1 tablet (10 mg total) by mouth as needed for erectile dysfunction. 20 tablet 1   valACYclovir (VALTREX) 500 MG tablet Take 500 mg by mouth 2 (two) times daily as needed.     No current facility-administered medications on file prior to visit.    Allergies  Allergen Reactions   Statins Other (See Comments)    ED and severe myalgias    Blood pressure (!) 130/100, pulse 83.   Assessment/Plan: HYPERTENSION CONTROL Vitals:   10/20/23 0910 10/20/23 0943  BP: (!) 140/96 (!) 130/100    The patient's blood pressure is elevated above target today.  In order to address the patient's elevated BP: A new medication was prescribed today.      1. Hypertension -  Essential hypertension Assessment: BP is not currently at goal <130/80 In clinic BP 140/96 with HR 83 on amlodipine  10 mg, lisinopril  40 mg, and chlorthalidone  25 mg Home BP readings not at goal - avg 120s/80s with Hrs 100s Denies any side effects with medications, specifically amlodipine  10 mg HR is  consistently elevated which can progress to cardiomyopathy. Recommend starting carvedilol  to help lower HR Patient wants to monitor HR at home for a few days to see if it decreases. If not, patient will start taking carvedilol  Initiate carvedilol  3.125 mg BID with meals Continue checking BP at home with BP log. Bring into next appointment  Plan: Initiate carvedilol  3.125 mg BID with meals Continue amlodipine  10 mg, lisinopril  40 mg, chlorthalidone  25 mg Continue to check BP and HR at home with BP log. Follow up in 1 month    Jenkins Graces, PharmD PGY1 Pharmacy  Resident [phone]   Thank you  Korbin Notaro D Zykia Walla, Pharm.JONETTA SARAN, CPP [facility] A Division of [facility] [address], [place], [place] [address]  Phone: [phone]; Fax: [phone]

## 2023-11-16 ENCOUNTER — Other Ambulatory Visit: Payer: Self-pay

## 2023-11-18 MED ORDER — CHLORTHALIDONE 25 MG PO TABS: 25.0000 mg | ORAL_TABLET | Freq: Every day | ORAL | 0 refills | Status: AC

## 2023-11-25 ENCOUNTER — Ambulatory Visit: Attending: Pharmacist | Admitting: Pharmacist

## 2023-11-25 NOTE — Progress Notes (Deleted)
 Patient ID: Andres Ortiz                 DOB: Aug 25, 1980                      MRN: [id number]      HPI: Andres Ortiz is a 43 y.o. male referred by Dr. Elmira to HTN/lipid clinic. PMH is significant for hypertension, prediabetes, obesity BMI 30-34.9, HLD, chronic low back pain.   At pharmacy visit 1/8 chlorthalidone  25 mg was added to HTN regimen. Pharmacy saw him 04/30/23. BP was 160/94. He had been out of amlodipine  for 3 weeks. His home readings off of amlodipine  were better. He had reported decrease in ETOH intake. He was advised to stop IBU. He had not started Repatha . Patient was send me home readings in 1 week.  He was to resume amlodipine  it BP above goal. PharmD called pt to follow up when no readings were received. He stated he had started Repatha  and would have his wife send me readings, but none were received. He no showed his last appointment with me.    Patient saw pharmacy 08/24/23. BP was 166/120 in office. He reported controlled home BP ~120/60. Had not been taking his amlodipine . PharmD asked him to resume amlodipine  at 5mg  daily.   Last seen on 09/16/23. Patient was out of lisinopril  for 1 to 1.5 weeks as refill request was sent to the wrong doctor. Home readings were within goal. Home BP machine was brought into clinic. In clinic BP and home BP readings were similar at 140s/90-100s.  At last visit, patient brought in 4 home readings. Home readings close to goal with average DBP 81. HR were elevated. BP in clinic was high. He was started on carvedilol  3.125mg  twice a day.   Home readings? Start carvedilol ? Compliance?  Today, patient presents for HTN follow up. He states that he's doing well. He reports that he has been taking his medications as directed, typically in the morning. He self titrated amlodipine  to 10 mg daily. He has been taking this for ~1 week. During the last month, he has missed 3-4 days of medication.   Reports working out more during the week  than before. Works out in [employer] and at home about 5 days a week. Walks in the evenings with daughter about 2 miles a day. Increased fruits and veggies (broccoli) into diet. Does not eat out at all.  He checks his BP in the evenings after work, walking, or completing a task. This is typically when he remembers. He states that he will wait a few minutes before checking his BP. Readings listed below:  127/85 119/80 107 118/80 108 92/76 100 Some HR in 80s-90s  In-clinic BP elevated 140/96 with HR 83. Heart rates are consistently high. Educated on repercussions of having elevated heart rate overtime can lead to development of cardiomyopathy. Patient amendable to starting carvedilol  to reduce HR.  Current HTN meds: lisinopril  40 mg daily,  chlorthalidone  25 mg daily, amlodipine  10 mg daily, carvedilol  3.125mg  twice a day Previously tried: hydrochlorothiazide  25 mg daily (headache), Bidil 20-37.5 mg TID  BP goal: <130/80  Family History:  Family History  Problem Relation Age of Onset   Hypertension Mother    Arthritis Mother    Arthritis Maternal Uncle    Muscular dystrophy Paternal Aunt    Arthritis Maternal Grandmother    Diabetes Maternal Grandmother    Muscular dystrophy Paternal Uncle  Social History: cut back on alcohol, still smoking marijuana  Diet: eats mainly home cooked meals - low salt and low fat  Breakfast: eggs, smoothies w/ flax/chia Lunch: almonds, fruit, protein shakes, bread Dinner: baked chicken (not fried), fruits, vegetables Snacks: fruits Drinks: 12-13 cups water/day  Exercise:  Walking daily (2 miles everyday) Back in the gym 3 days a week  Home BP readings:  127/85 119/80 107 118/80 108 92/76 100 Some HR in 80s-90s   Wt Readings from Last 3 Encounters:  02/25/23 223 lb 9.6 oz (101.4 kg)  11/11/22 216 lb 6.4 oz (98.2 kg)  06/08/22 225 lb (102.1 kg)   BP Readings from Last 3 Encounters:  10/20/23 (!) 130/100  09/16/23 (!) 146/102  08/24/23  (!) 166/120   Pulse Readings from Last 3 Encounters:  10/20/23 83  09/16/23 81  08/24/23 77    Renal function: CrCl cannot be calculated (Patient's most recent lab result is older than the maximum 21 days allowed.).  Past Medical History:  Diagnosis Date   BMI 34.0-34.9,adult    Hypertension    Irregular heart rate    They told me to always mention it but I don't know what it was.   Low back pain     Current Outpatient Medications on File Prior to Visit  Medication Sig Dispense Refill   Acetaminophen  Extra Strength 500 MG CAPS Take 2 capsules by mouth every 8 (eight) hours. (Patient not taking: Reported on 04/30/2023)     amLODipine  (NORVASC ) 10 MG tablet Take 0.5 tablets (5 mg total) by mouth daily. 45 tablet 3   carvedilol  (COREG ) 3.125 MG tablet Take 1 tablet (3.125 mg total) by mouth 2 (two) times daily with a meal. 60 tablet 3   chlorthalidone  (HYGROTON ) 25 MG tablet Take 1 tablet (25 mg total) by mouth daily. 90 tablet 0   Evolocumab  (REPATHA  SURECLICK) 140 MG/ML SOAJ Inject 140 mg into the skin every 14 (fourteen) days. 6 mL 3   ezetimibe  (ZETIA ) 10 MG tablet Take 1 tablet (10 mg total) by mouth daily. (Patient not taking: Reported on 04/30/2023) 90 tablet 3   lisinopril  (ZESTRIL ) 40 MG tablet Take 1 tablet (40 mg total) by mouth daily. 90 tablet 3   tadalafil  (CIALIS ) 10 MG tablet Take 1 tablet (10 mg total) by mouth as needed for erectile dysfunction. 20 tablet 1   valACYclovir (VALTREX) 500 MG tablet Take 500 mg by mouth 2 (two) times daily as needed.     No current facility-administered medications on file prior to visit.    Allergies  Allergen Reactions   Statins Other (See Comments)    ED and severe myalgias    There were no vitals taken for this visit.   Assessment/Plan: No BP recorded.  {Refresh Note OR Click here to enter BP  :1}***   1. Hypertension -  No problem-specific Assessment & Plan notes found for this encounter.     Jenkins Graces, PharmD PGY1  Pharmacy Resident [phone]   Thank you  Eleanor JONETTA Crews, Pharm.JONETTA SARAN, CPP [facility] [address] [place], [place] [address]  Phone: [phone]; Fax: [phone]

## 2023-12-09 ENCOUNTER — Other Ambulatory Visit: Payer: Self-pay | Admitting: Cardiology

## 2023-12-22 ENCOUNTER — Encounter: Payer: Self-pay | Admitting: Cardiology

## 2024-01-24 ENCOUNTER — Other Ambulatory Visit: Payer: Self-pay | Admitting: Cardiology

## 2024-01-25 ENCOUNTER — Encounter (HOSPITAL_BASED_OUTPATIENT_CLINIC_OR_DEPARTMENT_OTHER): Payer: Self-pay
